# Patient Record
Sex: Male | Born: 1990 | Race: White | Hispanic: No | Marital: Single | Smoking: Current some day smoker
Health system: Southern US, Community
[De-identification: ages and names within clinical notes are randomized; demographics above are authoritative.]

## PROBLEM LIST (undated history)

## (undated) DIAGNOSIS — B360 Pityriasis versicolor: Secondary | ICD-10-CM

## (undated) DIAGNOSIS — N200 Calculus of kidney: Secondary | ICD-10-CM

## (undated) HISTORY — PX: ROTATOR CUFF REPAIR: SHX139

## (undated) HISTORY — PX: ANTERIOR CRUCIATE LIGAMENT REPAIR: SHX115

---
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
## 2008-08-08 IMAGING — CT CT ABDOMEN W/O CM
1 of 2 series · 15 of 32 positions shown, 19 images · non-contrast
Comparison: none

HISTORY: Right lower quadrant abdominal pain, unable to urinate

[Series 2: abd|pel w/o 5.0 b40f · axial · non-contrast · 0.70mm/px · z∈[-514,-84]mm · 15 of 94 slices shown, 19 images]
[im 4/94  soft-tissue]
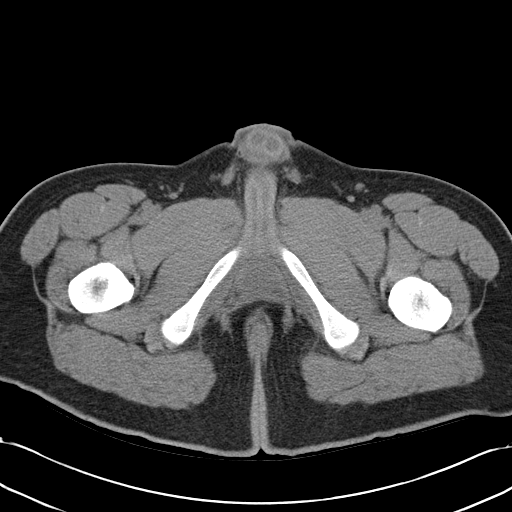
[im 4/94  bone]
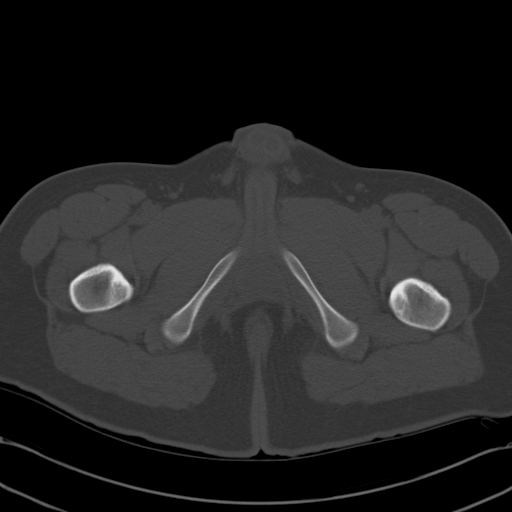
[im 12/94  soft-tissue]
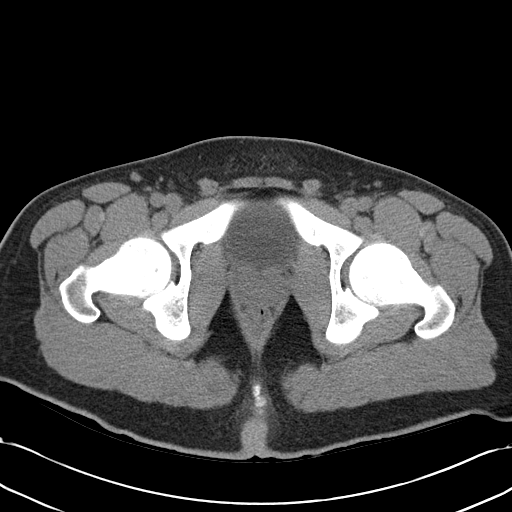
[im 20/94  soft-tissue]
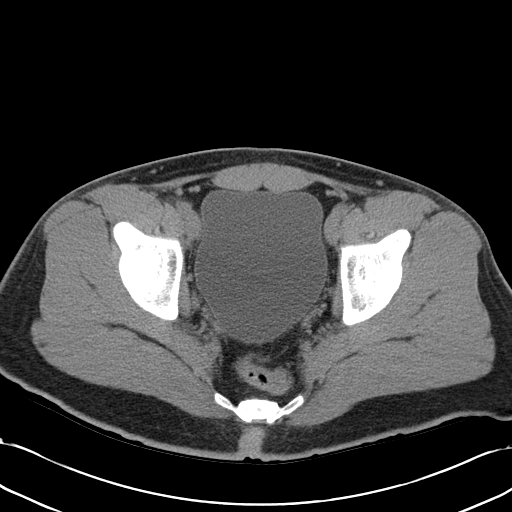
[im 28/94  soft-tissue]
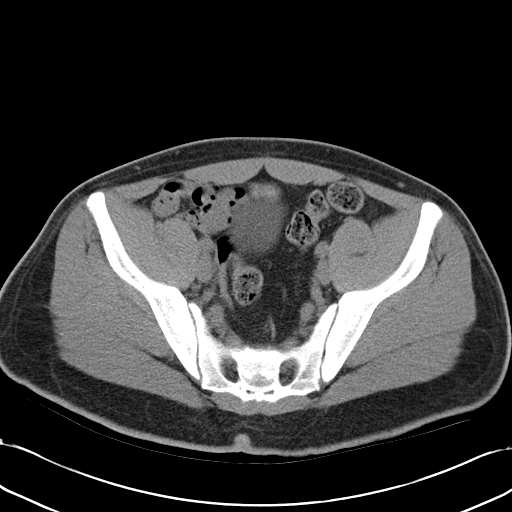
[im 32/94  soft-tissue]
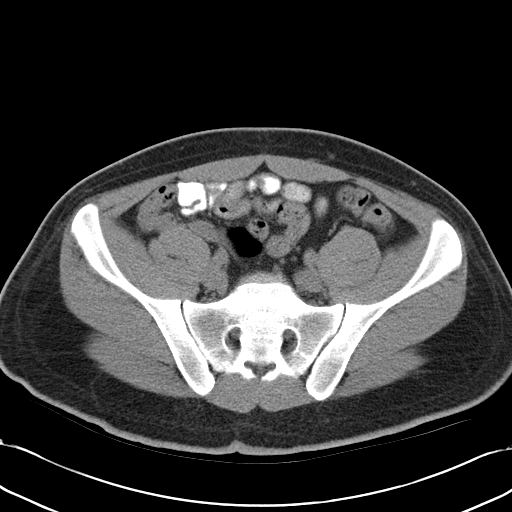
[im 39/94  soft-tissue]
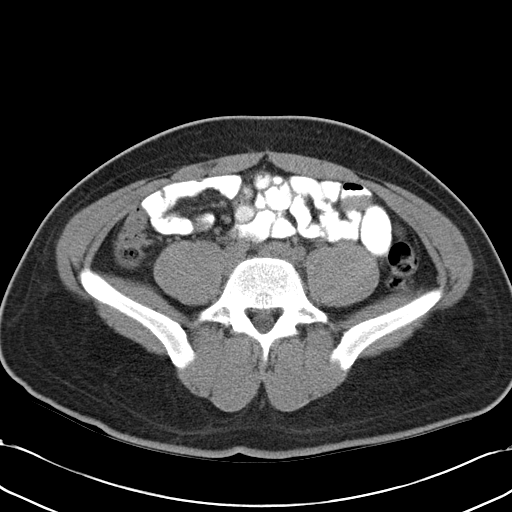
[im 47/94  soft-tissue]
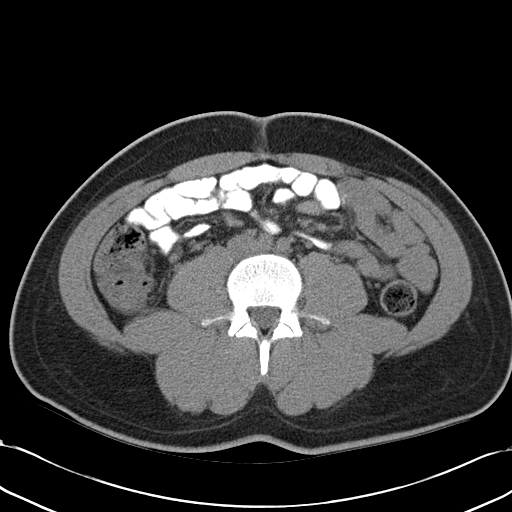
[im 55/94  soft-tissue]
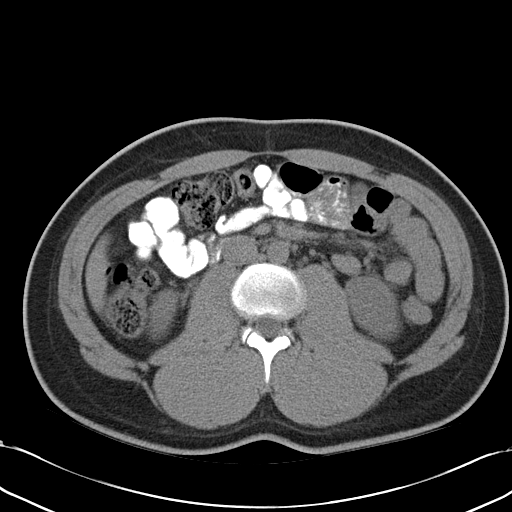
[im 63/94  soft-tissue]
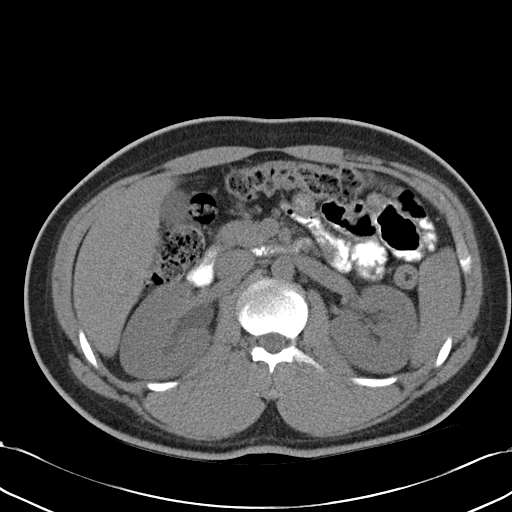
[im 63/94  bone]
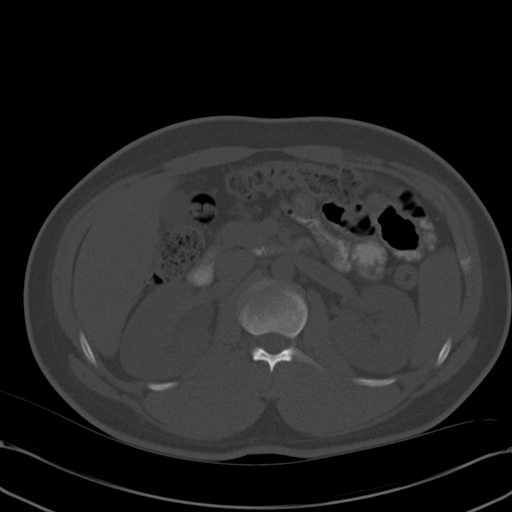
[im 66/94  soft-tissue]
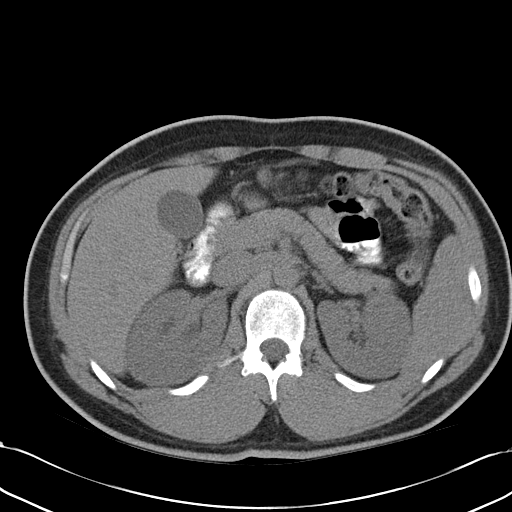
[im 74/94  soft-tissue]
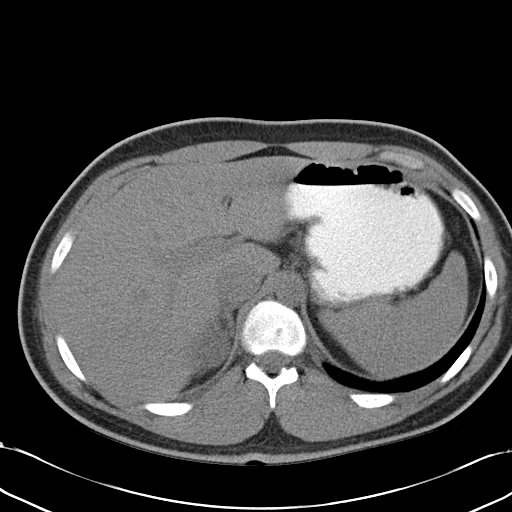
[im 78/94  lung]
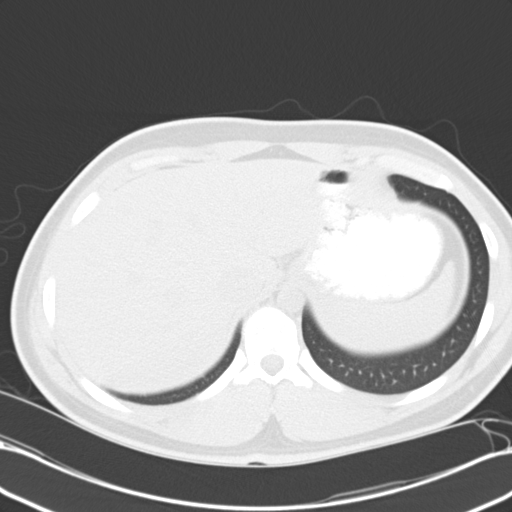
[im 82/94  soft-tissue]
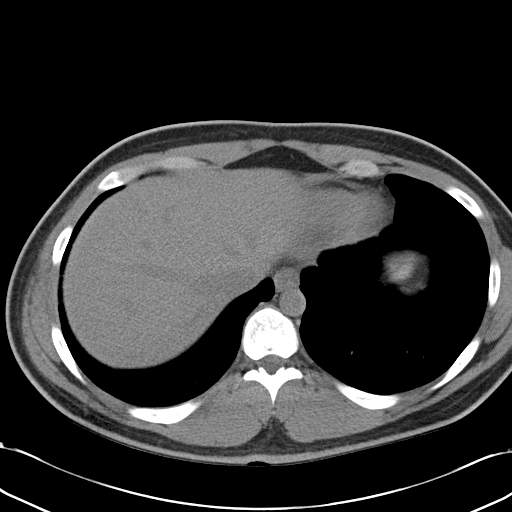
[im 82/94  lung]
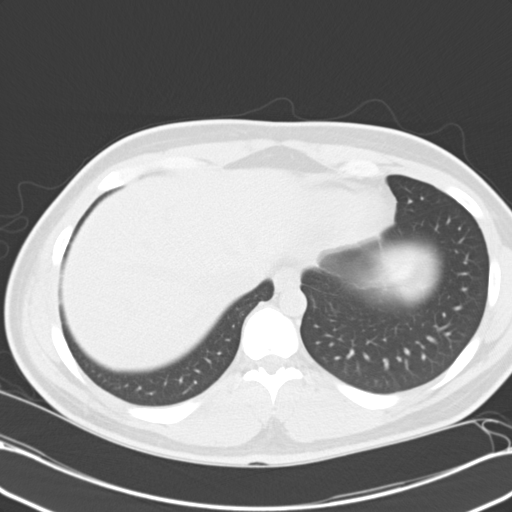
[im 86/94  lung]
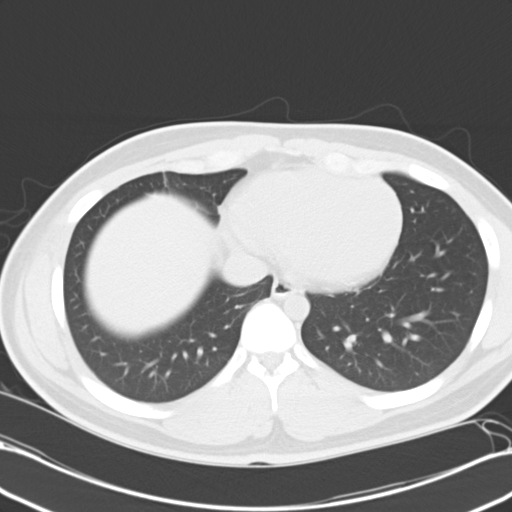
[im 90/94  soft-tissue]
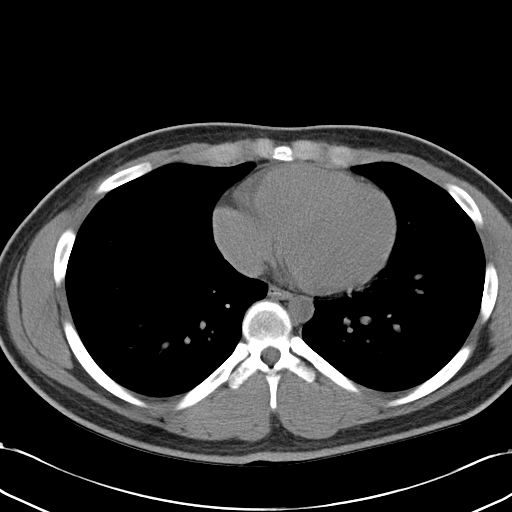
[im 90/94  lung]
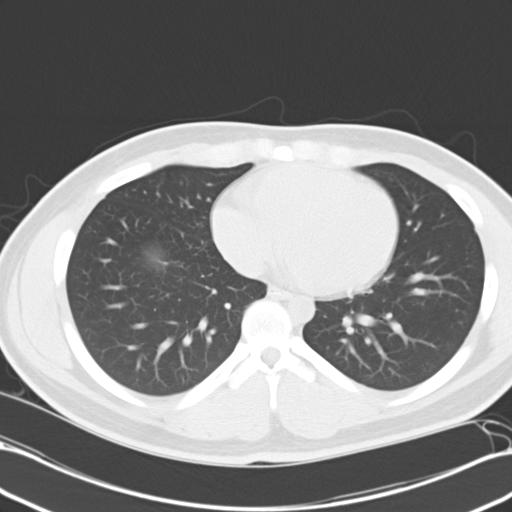

[15 of 32 positions shown; findings below may reference images not displayed]

CT ABDOMEN AND PELVIS WITHOUT CONTRAST:

Multidetector noncontrast helical CT imaging abdomen and pelvis performed.
Sagittal and coronal images reconstructed from axial data set.
No prior exam for comparison.

CT ABDOMEN:

Lung bases clear.
Mild right hydronephrosis secondary to proximal right ureteral calculus 2.5 mm
diameter seen adjacent to right psoas muscle image 42.
No other urinary tract calcification evident.
Vague low attenuation focus lateral aspect of mid right kidney 9 mm diameter
image 27 question cyst.
Remaining solid organs and bowel loops in upper abdomen unremarkable for exam
lacking IV contrast.
Few normal size mesenteric lymph nodes in right midabdomen.
No upper abdominal mass, adenopathy, free fluid, or inflammatory process.
IMPRESSION: 2.5 mm diameter proximal right ureteral calculus causing mild right
hydronephrosis.

CT PELVIS:

Normal appendix.
Normal appearance of bladder and distal ureters without additional
calcification.
Large and small bowel loops in pelvis normal.
No pelvic mass, adenopathy, free fluid, or inflammatory process.
No bone abnormalities.
IMPRESSION: No additional pelvic abnormalities, see CT abdomen report.

## 2009-01-26 ENCOUNTER — Emergency Department (HOSPITAL_COMMUNITY): Admission: EM | Admit: 2009-01-26 | Discharge: 2009-01-26 | Payer: Self-pay | Admitting: Emergency Medicine
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
## 2009-02-08 ENCOUNTER — Emergency Department (HOSPITAL_COMMUNITY): Admission: EM | Admit: 2009-02-08 | Discharge: 2009-02-08 | Payer: Self-pay | Admitting: Emergency Medicine

## 2009-08-07 ENCOUNTER — Ambulatory Visit (HOSPITAL_COMMUNITY): Admission: RE | Admit: 2009-08-07 | Discharge: 2009-08-07 | Payer: Self-pay | Admitting: Family Medicine

## 2009-12-17 ENCOUNTER — Emergency Department (HOSPITAL_COMMUNITY): Admission: EM | Admit: 2009-12-17 | Discharge: 2009-12-17 | Payer: Self-pay | Admitting: Emergency Medicine

## 2010-07-04 IMAGING — CT CT PELVIS W/ CM
2 of 4 series · 17 of 46 positions shown, 19 images · IV contrast (Omnipaque 300)
Comparison: 08/27/2007

CT ABDOMEN

CLINICAL DATA: Right-sided flank pain

CT ABDOMEN AND PELVIS WITH CONTRAST
TECHNIQUE: Multidetector CT imaging of the abdomen and pelvis was
performed using the standard protocol following bolus
administration of intravenous contrast.
Contrast: 100 ml Omniscan 300 IV contrast

[Series 2: abd_pel_with 5.0 b40f · axial · 0.78mm/px · z∈[-520,-120]mm · 14 of 88 slices shown, 16 images]
[im 4/88  soft-tissue]
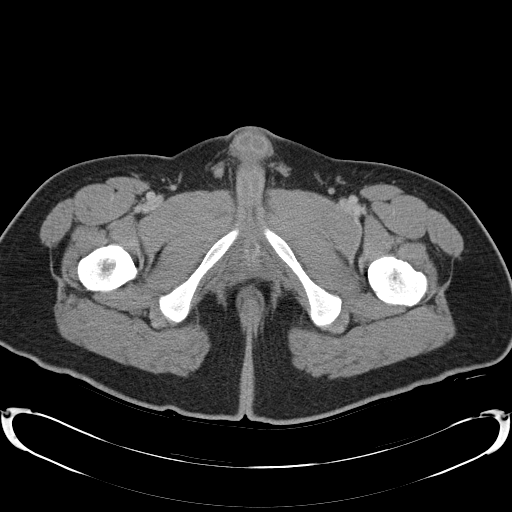
[im 4/88  bone]
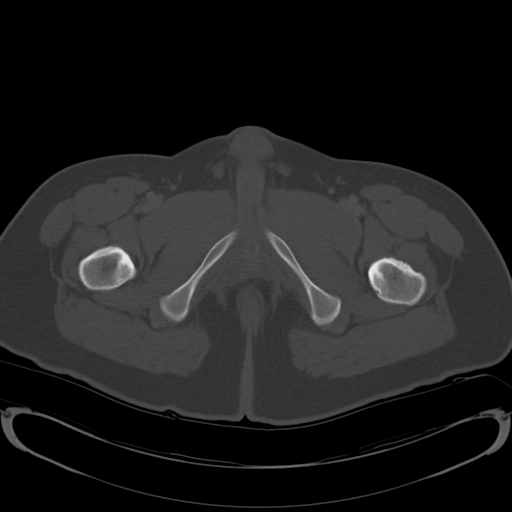
[im 11/88  soft-tissue]
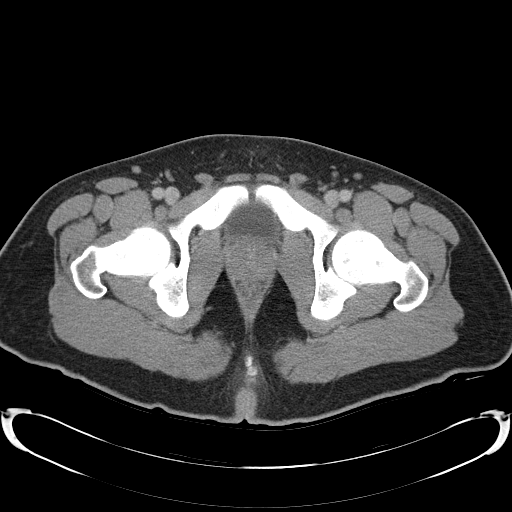
[im 18/88  soft-tissue]
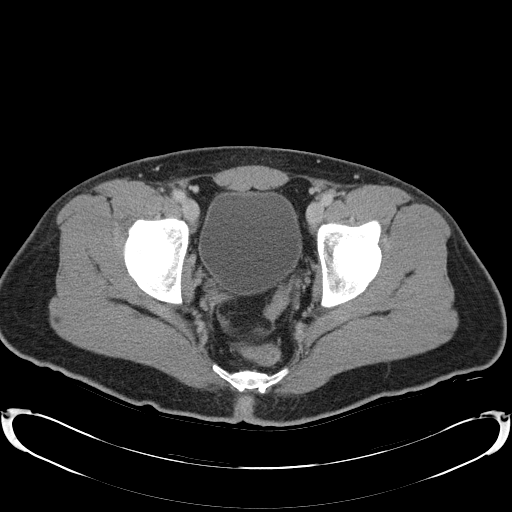
[im 25/88  soft-tissue]
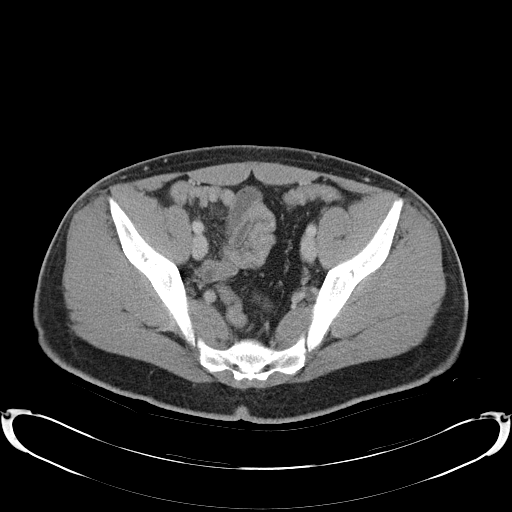
[im 28/88  soft-tissue]
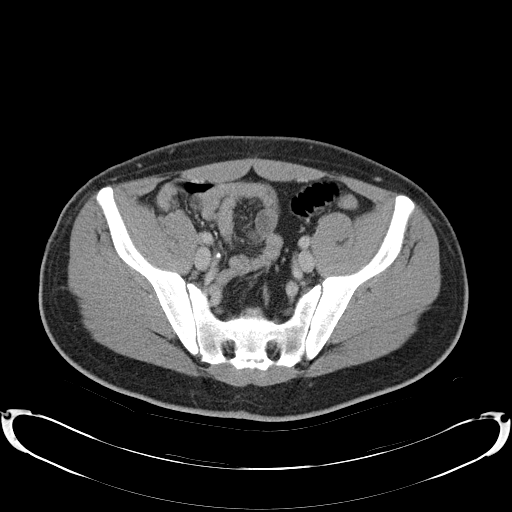
[im 35/88  soft-tissue]
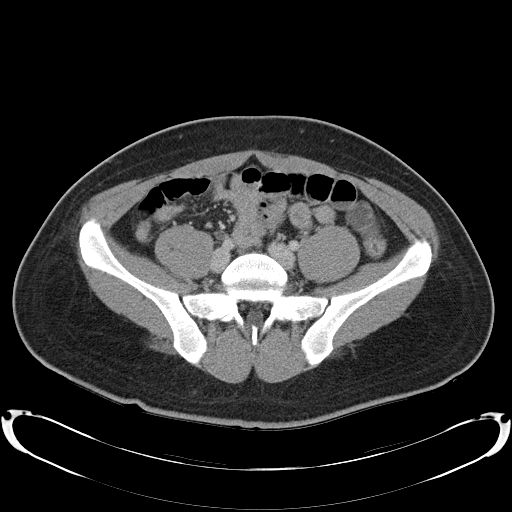
[im 42/88  soft-tissue]
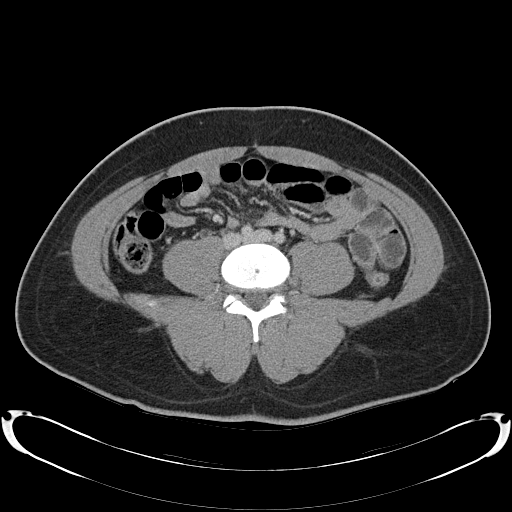
[im 46/88  soft-tissue]
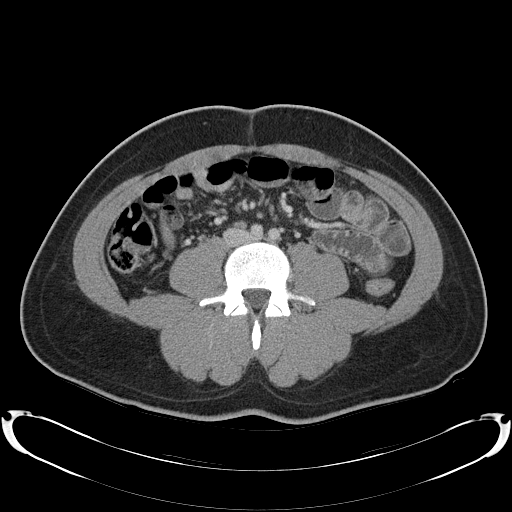
[im 53/88  soft-tissue]
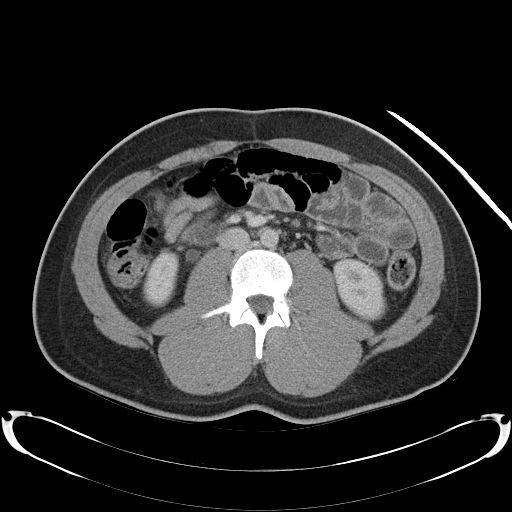
[im 53/88  bone]
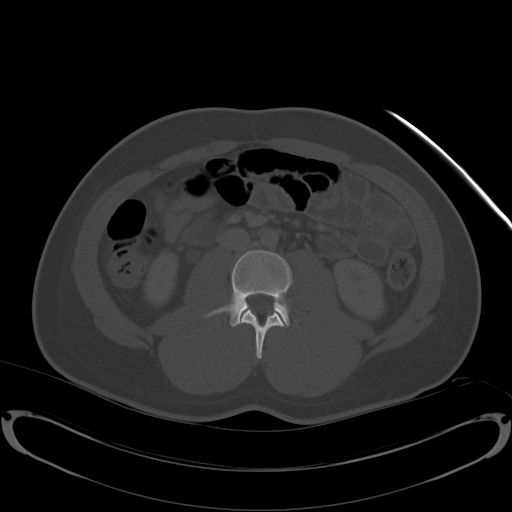
[im 60/88  soft-tissue]
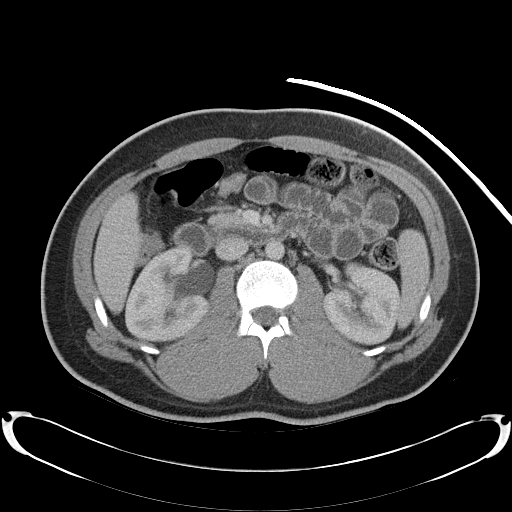
[im 67/88  soft-tissue]
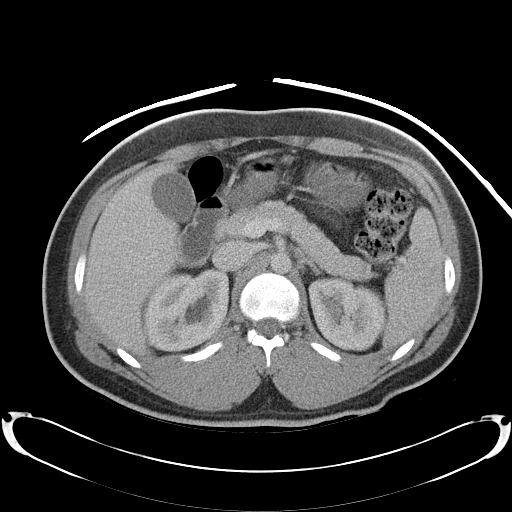
[im 70/88  soft-tissue]
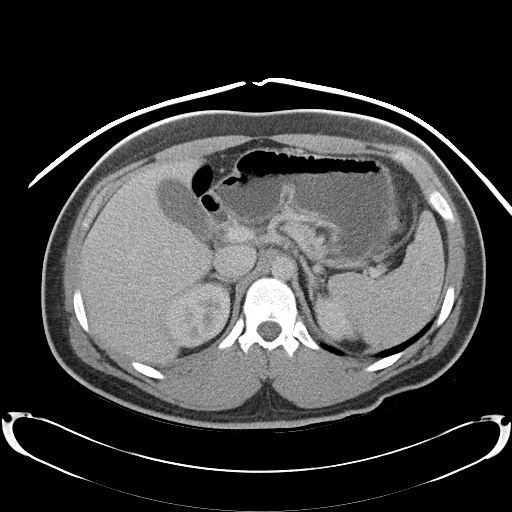
[im 77/88  soft-tissue]
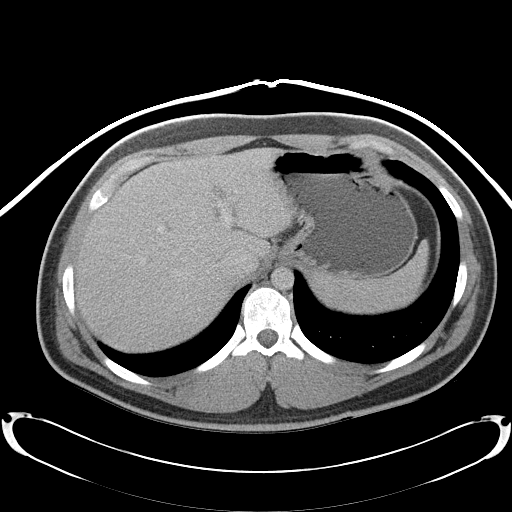
[im 84/88  soft-tissue]
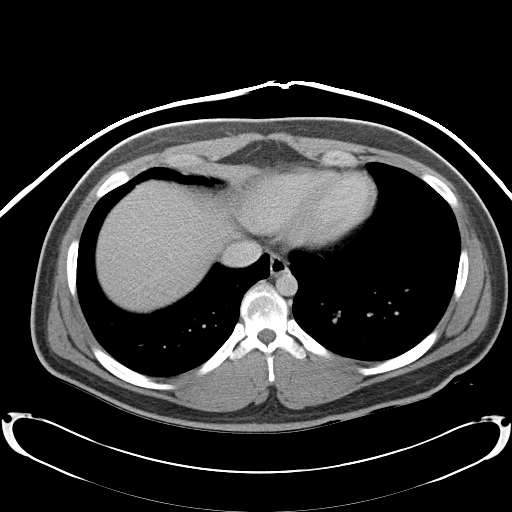

[Series 4: mpr cor post contrast (id) · coronal · 0.78mm/px · 3 of 76 slices shown]
[im 26/76  soft-tissue]
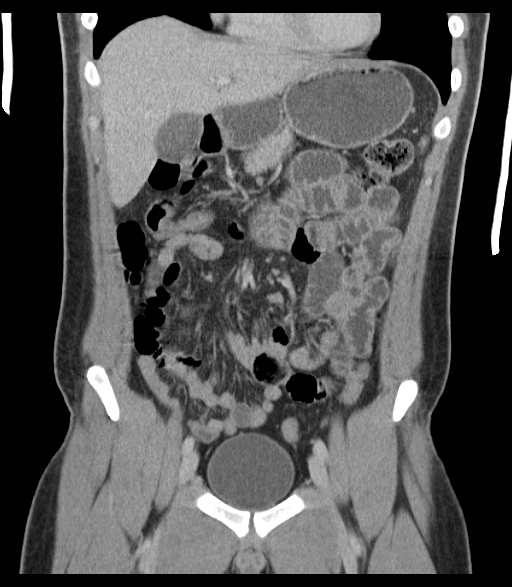
[im 34/76  soft-tissue]
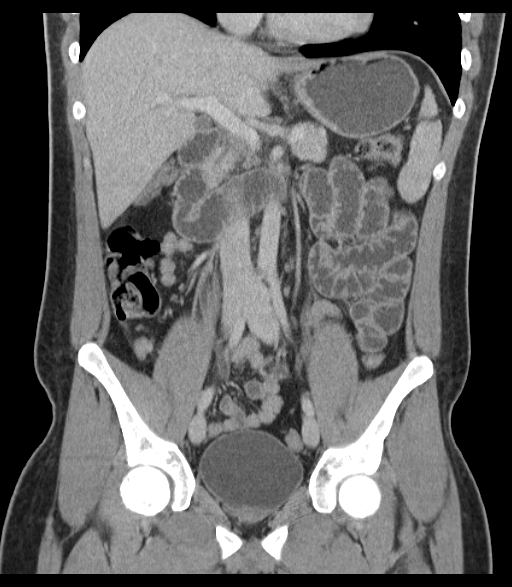
[im 42/76  soft-tissue]
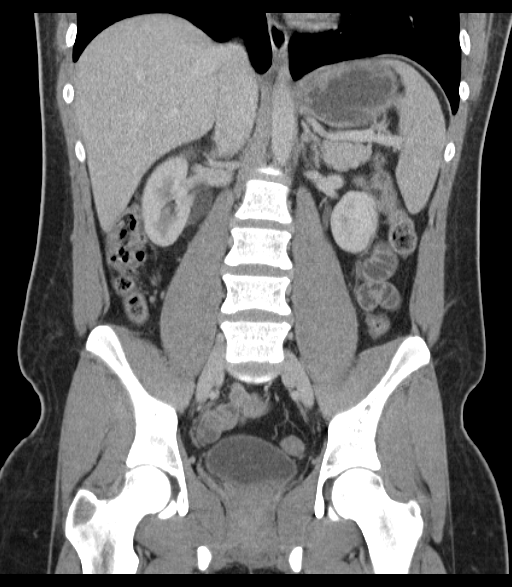

[17 of 46 positions shown; findings below may reference images not displayed]

FINDINGS: Mild fullness of the right intrarenal collecting system
and the ureter is noted to the level of a 4 mm distal right
ureteral calculus on image 61.  No other radiopaque renal or
ureteral calculus is seen.  Too small to characterize right renal
cortical hypodensities again noted.  The abdominal viscera are
otherwise unremarkable.  No ascites or lymphadenopathy.
IMPRESSION: 4 mm distal right ureteral calculus with mild fullness of the right
infrarenal collecting system and right ureter.

CT PELVIS
FINDINGS: The bowel is unremarkable.  The appendix is not
definitely identified but there is no secondary evidence for acute
appendicitis.  Bladder is unremarkable.  No free fluid or
lymphadenopathy.  No acute osseous abnormality.  Probable bone
island again noted in the region of the left acetabulum.
IMPRESSION: No acute intrapelvic finding.  Please see CT abdomen report above.

## 2010-08-21 LAB — CBC
MCHC: 34.9 g/dL (ref 30.0–36.0)
MCV: 95 fL (ref 78.0–100.0)
RBC: 4.71 MIL/uL (ref 4.22–5.81)
RDW: 12.8 % (ref 11.5–15.5)

## 2010-08-21 LAB — COMPREHENSIVE METABOLIC PANEL
AST: 19 U/L (ref 0–37)
CO2: 25 mEq/L (ref 19–32)
Calcium: 9.5 mg/dL (ref 8.4–10.5)
Creatinine, Ser: 0.89 mg/dL (ref 0.4–1.5)
GFR calc Af Amer: >60 mL/min (ref >60)
GFR calc non Af Amer: >60 mL/min (ref >60)
Glucose, Bld: 101 mg/dL — ABNORMAL HIGH (ref 70–99)
Total Protein: 7.5 g/dL (ref 6.0–8.3)

## 2010-08-21 LAB — URINALYSIS, ROUTINE W REFLEX MICROSCOPIC
Glucose, UA: NEGATIVE mg/dL
Ketones, ur: 15 mg/dL — AB
Leukocytes, UA: NEGATIVE
Nitrite: NEGATIVE
Nitrite: NEGATIVE
Protein, ur: 100 mg/dL — AB
Specific Gravity, Urine: 1.015 (ref 1.005–1.030)
Urobilinogen, UA: 1 mg/dL (ref 0.0–1.0)
pH: 8.5 — ABNORMAL HIGH (ref 5.0–8.0)

## 2010-08-21 LAB — DIFFERENTIAL
Lymphocytes Relative: 12 % (ref 12–46)
Lymphs Abs: 1.4 10*3/uL (ref 0.7–4.0)
Monocytes Relative: 7 % (ref 3–12)
Neutrophils Relative %: 81 % — ABNORMAL HIGH (ref 43–77)

## 2010-08-21 LAB — POCT I-STAT, CHEM 8
BUN: 14 mg/dL (ref 6–23)
Calcium, Ion: 1.16 mmol/L (ref 1.12–1.32)
Creatinine, Ser: 1.1 mg/dL (ref 0.4–1.5)
Glucose, Bld: 84 mg/dL (ref 70–99)
Hemoglobin: 14.6 g/dL (ref 13.0–17.0)
Sodium: 141 mEq/L (ref 135–145)
TCO2: 25 mmol/L (ref 0–100)

## 2010-08-21 LAB — LIPASE, BLOOD: Lipase: 20 U/L (ref 11–59)

## 2010-08-21 LAB — URINE MICROSCOPIC-ADD ON
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
## 2011-02-24 LAB — CBC
MCV: 91.9
Platelets: 247
WBC: 13.8 — ABNORMAL HIGH

## 2011-02-24 LAB — URINALYSIS, ROUTINE W REFLEX MICROSCOPIC
Bilirubin Urine: NEGATIVE
Nitrite: NEGATIVE
pH: 6.5

## 2011-02-24 LAB — DIFFERENTIAL
Eosinophils Absolute: 0
Lymphocytes Relative: 9 — ABNORMAL LOW
Lymphs Abs: 1.2
Neutro Abs: 11.4 — ABNORMAL HIGH
Neutrophils Relative %: 83 — ABNORMAL HIGH

## 2011-02-24 LAB — LIPASE, BLOOD: Lipase: 15

## 2011-02-24 LAB — BASIC METABOLIC PANEL
BUN: 11
Calcium: 9.3
Creatinine, Ser: 1.01
GFR calc non Af Amer: >60

## 2012-04-17 ENCOUNTER — Emergency Department (HOSPITAL_COMMUNITY)
Admission: EM | Admit: 2012-04-17 | Discharge: 2012-04-17 | Disposition: A | Discharge status: Home / Self Care | Payer: Self-pay | Attending: Emergency Medicine | Admitting: Emergency Medicine

## 2012-04-17 ENCOUNTER — Encounter (HOSPITAL_COMMUNITY): Payer: Self-pay | Admitting: Emergency Medicine

## 2012-04-17 ENCOUNTER — Encounter (HOSPITAL_COMMUNITY): Payer: Self-pay | Admitting: Nurse Practitioner

## 2012-04-17 DIAGNOSIS — R110 Nausea: Secondary | ICD-10-CM | POA: Insufficient documentation

## 2012-04-17 DIAGNOSIS — Z87442 Personal history of urinary calculi: Secondary | ICD-10-CM | POA: Insufficient documentation

## 2012-04-17 DIAGNOSIS — N201 Calculus of ureter: Secondary | ICD-10-CM | POA: Insufficient documentation

## 2012-04-17 DIAGNOSIS — R109 Unspecified abdominal pain: Secondary | ICD-10-CM | POA: Insufficient documentation

## 2012-04-17 DIAGNOSIS — R319 Hematuria, unspecified: Secondary | ICD-10-CM | POA: Insufficient documentation

## 2012-04-17 HISTORY — DX: Calculus of kidney: N20.0

## 2012-04-17 LAB — COMPREHENSIVE METABOLIC PANEL
ALT: 18 U/L (ref 0–53)
AST: 19 U/L (ref 0–37)
Albumin: 4.5 g/dL (ref 3.5–5.2)
Alkaline Phosphatase: 71 U/L (ref 39–117)
BUN: 13 mg/dL (ref 6–23)
CO2: 25 mEq/L (ref 19–32)
Calcium: 9.8 mg/dL (ref 8.4–10.5)
Chloride: 104 mEq/L (ref 96–112)
Creatinine, Ser: 0.92 mg/dL (ref 0.50–1.35)
GFR calc Af Amer: >90 mL/min (ref >90)
GFR calc non Af Amer: >90 mL/min (ref >90)
Glucose, Bld: 109 mg/dL — ABNORMAL HIGH (ref 70–99)
Potassium: 3.8 mEq/L (ref 3.5–5.1)
Sodium: 141 mEq/L (ref 135–145)
Total Bilirubin: 0.5 mg/dL (ref 0.3–1.2)
Total Protein: 7.8 g/dL (ref 6.0–8.3)

## 2012-04-17 LAB — URINALYSIS, MICROSCOPIC ONLY
Bilirubin Urine: NEGATIVE
Glucose, UA: NEGATIVE mg/dL
Ketones, ur: NEGATIVE mg/dL
Leukocytes, UA: NEGATIVE
Nitrite: NEGATIVE
Protein, ur: NEGATIVE mg/dL
Specific Gravity, Urine: 1.028 (ref 1.005–1.030)
Urobilinogen, UA: 0.2 mg/dL (ref 0.0–1.0)
pH: 6 (ref 5.0–8.0)

## 2012-04-17 LAB — CBC WITH DIFFERENTIAL/PLATELET
Basophils Absolute: 0 10*3/uL (ref 0.0–0.1)
Basophils Relative: 0 % (ref 0–1)
Eosinophils Absolute: 0 10*3/uL (ref 0.0–0.7)
Eosinophils Relative: 0 % (ref 0–5)
HCT: 44.3 % (ref 39.0–52.0)
Hemoglobin: 15.2 g/dL (ref 13.0–17.0)
Lymphocytes Relative: 19 % (ref 12–46)
Lymphs Abs: 2 10*3/uL (ref 0.7–4.0)
MCH: 30.8 pg (ref 26.0–34.0)
MCHC: 34.3 g/dL (ref 30.0–36.0)
MCV: 89.9 fL (ref 78.0–100.0)
Monocytes Absolute: 0.8 10*3/uL (ref 0.1–1.0)
Monocytes Relative: 8 % (ref 3–12)
Neutro Abs: 7.7 10*3/uL (ref 1.7–7.7)
Neutrophils Relative %: 73 % (ref 43–77)
Platelets: 206 10*3/uL (ref 150–400)
RBC: 4.93 MIL/uL (ref 4.22–5.81)
RDW: 13.5 % (ref 11.5–15.5)
WBC: 10.6 10*3/uL — ABNORMAL HIGH (ref 4.0–10.5)

## 2012-04-17 LAB — LIPASE, BLOOD: Lipase: 20 U/L (ref 11–59)

## 2012-04-17 MED ORDER — ONDANSETRON HCL 4 MG/2ML IJ SOLN
4.0000 mg | Freq: Once | INTRAMUSCULAR | Status: AC
  Administered 2012-04-17: 4 mg via INTRAVENOUS
  Filled 2012-04-17: qty 2

## 2012-04-17 MED ORDER — TAMSULOSIN HCL 0.4 MG PO CAPS: 0.4000 mg | ORAL_CAPSULE | Freq: Every day | ORAL | Status: DC

## 2012-04-17 MED ORDER — HYDROMORPHONE HCL PF 1 MG/ML IJ SOLN
1.0000 mg | Freq: Once | INTRAMUSCULAR | Status: AC
  Administered 2012-04-17: 1 mg via INTRAVENOUS
  Filled 2012-04-17: qty 1

## 2012-04-17 MED ORDER — OXYCODONE-ACETAMINOPHEN 5-325 MG PO TABS: 1.0000 | ORAL_TABLET | ORAL | Status: DC | PRN

## 2012-04-17 MED ORDER — ONDANSETRON HCL 4 MG PO TABS: 4.0000 mg | ORAL_TABLET | Freq: Four times a day (QID) | ORAL | Status: DC

## 2012-04-17 MED ORDER — KETOROLAC TROMETHAMINE 30 MG/ML IJ SOLN
30.0000 mg | Freq: Once | INTRAMUSCULAR | Status: AC
  Administered 2012-04-17: 30 mg via INTRAVENOUS
  Filled 2012-04-17: qty 1

## 2012-04-17 NOTE — ED Provider Notes (Signed)
History     21 year old male with flank pain. Left-sided. Triage note reviewed, but pt says pain has never been on R. T. onset approximately 2 hours prior to presentation. Pain is sharp and radiates down into his groin. No appreciable exacerbating relieving factors. No urinary complaints. Reports prior history kidney stones and says her current symptoms feel similar. Denies previous abdominal or pelvic surgery. Nausea but no vomiting. No testicular pain or swelling. No penile discharge. No rash. No fever or chills.   CSN: [id number]  Arrival date & time 04/17/12  1412   First MD Initiated Contact with Patient 04/17/12 1911      Chief Complaint  Patient presents with  . Abdominal Pain    (Consider location/radiation/quality/duration/timing/severity/associated sxs/prior treatment) HPI  Past Medical History  Diagnosis Date  . Kidney stone     Past Surgical History  Procedure Date  . Anterior cruciate ligament repair     No family history on file.  History  Substance Use Topics  . Smoking status: Never Smoker   . Smokeless tobacco: Current User    Types: Chew  . Alcohol Use: Yes      Review of Systems   Review of symptoms negative unless otherwise noted in HPI.   Allergies  Review of patient's allergies indicates no known allergies.  Home Medications  No current outpatient prescriptions on file.  BP 131/67  Pulse 86  Temp 98.8 F (37.1 C) (Oral)  Resp 16  SpO2 99%  Physical Exam  Nursing note and vitals reviewed. Constitutional: He appears well-developed and well-nourished. No distress.  HENT:  Head: Normocephalic and atraumatic.  Eyes: Conjunctivae normal are normal. Right eye exhibits no discharge. Left eye exhibits no discharge.  Neck: Neck supple.  Cardiovascular: Normal rate, regular rhythm and normal heart sounds.  Exam reveals no gallop and no friction rub.   No murmur heard. Pulmonary/Chest: Effort normal and breath sounds normal. No  respiratory distress.  Abdominal: Soft. He exhibits no distension. There is no tenderness. There is no rebound and no guarding.  Genitourinary:       Left costovertebral angle tenderness  Musculoskeletal: He exhibits tenderness. He exhibits no edema.  Neurological: He is alert.  Skin: Skin is warm and dry.  Psychiatric: He has a normal mood and affect. His behavior is normal. Thought content normal.    ED Course  Procedures (including critical care time)  Labs Reviewed  CBC WITH DIFFERENTIAL - Abnormal; Notable for the following:    WBC 10.6 (*)     All other components within normal limits  COMPREHENSIVE METABOLIC PANEL - Abnormal; Notable for the following:    Glucose, Bld 109 (*)     All other components within normal limits  URINALYSIS, MICROSCOPIC ONLY - Abnormal; Notable for the following:    Hgb urine dipstick LARGE (*)     All other components within normal limits  LIPASE, BLOOD  LAB REPORT - SCANNED   No results found.   1. Flank pain       MDM  21 year old male with left flank pain. History kidney stones states that current symptoms feel similar. UA with blood consistent with stones. Urinalysis otherwise not suggestive of infection. Abdominal exam benign. Patient is afebrile. No ongoing vomiting. Renal function is normal. Imaging considered but deferred. Plan expectant management. Emergent return cautions were discussed. Outpatient urological followup otherwise.        Raeford Razor, MD 04/21/12 [phone]

## 2012-04-17 NOTE — ED Notes (Signed)
Patient c/o left sided pain since 1300 today; patient was just discharged from [facility] ER for flank pain.  Pain returned when he got home; called Eunice back and was told to come to ER.

## 2012-04-17 NOTE — ED Notes (Signed)
Pt c/o R side pain onset 2 hours ago. Describes as "stabbing" and states it feels like a kidney stone. Denies bowel/bladder changes

## 2012-04-17 NOTE — ED Notes (Signed)
Patient stated BP cuff was too tight and took it off; unable to obtain vitals.

## 2012-04-18 ENCOUNTER — Emergency Department (HOSPITAL_COMMUNITY)
Admission: EM | Admit: 2012-04-18 | Discharge: 2012-04-18 | Disposition: A | Discharge status: Home / Self Care | Payer: Self-pay | Attending: Emergency Medicine | Admitting: Emergency Medicine

## 2012-04-18 ENCOUNTER — Emergency Department (HOSPITAL_COMMUNITY): Payer: Self-pay

## 2012-04-18 DIAGNOSIS — N201 Calculus of ureter: Secondary | ICD-10-CM

## 2012-04-18 MED ORDER — ONDANSETRON HCL 4 MG/2ML IJ SOLN
4.0000 mg | Freq: Once | INTRAMUSCULAR | Status: AC
  Administered 2012-04-18: 4 mg via INTRAVENOUS
  Filled 2012-04-18: qty 2

## 2012-04-18 MED ORDER — KETOROLAC TROMETHAMINE 30 MG/ML IJ SOLN
30.0000 mg | Freq: Once | INTRAMUSCULAR | Status: AC
  Administered 2012-04-18: 30 mg via INTRAVENOUS
  Filled 2012-04-18: qty 1

## 2012-04-18 MED ORDER — SODIUM CHLORIDE 0.9 % IV BOLUS (SEPSIS)
1000.0000 mL | Freq: Once | INTRAVENOUS | Status: AC
  Administered 2012-04-18: 1000 mL via INTRAVENOUS

## 2012-04-18 MED ORDER — HYDROMORPHONE HCL PF 1 MG/ML IJ SOLN
1.0000 mg | Freq: Once | INTRAMUSCULAR | Status: AC
  Administered 2012-04-18: 1 mg via INTRAVENOUS
  Filled 2012-04-18: qty 1

## 2012-04-18 NOTE — ED Provider Notes (Signed)
History     CSN: [id number]  Arrival date & time 04/17/12  2329   First MD Initiated Contact with Patient 04/18/12 0034      Chief Complaint  Patient presents with  . Abdominal Pain    (Consider location/radiation/quality/duration/timing/severity/associated sxs/prior treatment) HPI Comments: 21 yo male with ho ks present with acute onset of intermittent sharp and stabbing pain that starts in the left pain radiates to the left upper quadrant and left lower quadrant. He has associated hematuria. He was seen at another emergency department earlier in the day, diagnosed with a kidney stone after he was found to have hematuria and typical kidney stone pain. At this time the pain has returned, it is not improved with home medications. He has associated nausea but denies chest pain, cough, shortness of breath, headache, diarrhea, swelling, rashes or any other complaints.  Patient is a 21 y.o. male presenting with abdominal pain. The history is provided by the patient and medical records.  Abdominal Pain The primary symptoms of the illness include abdominal pain.    Past Medical History  Diagnosis Date  . Kidney stone     Past Surgical History  Procedure Date  . Anterior cruciate ligament repair     No family history on file.  History  Substance Use Topics  . Smoking status: Never Smoker   . Smokeless tobacco: Current User    Types: Chew  . Alcohol Use: Yes      Review of Systems  Gastrointestinal: Positive for abdominal pain.  All other systems reviewed and are negative.    Allergies  Review of patient's allergies indicates no known allergies.  Home Medications   Current Outpatient Rx  Name  Route  Sig  Dispense  Refill  . ONDANSETRON HCL 4 MG PO TABS   Oral   Take 1 tablet (4 mg total) by mouth every 6 (six) hours.   12 tablet   0   . OXYCODONE-ACETAMINOPHEN 5-325 MG PO TABS   Oral   Take 1-2 tablets by mouth every 4 (four) hours as needed for pain.   20  tablet   0   . TAMSULOSIN HCL 0.4 MG PO CAPS   Oral   Take 1 capsule (0.4 mg total) by mouth daily.   5 capsule   0     BP 121/61  Pulse 85  Temp 98.3 F (36.8 C) (Oral)  Resp 20  SpO2 97%  Physical Exam  Nursing note and vitals reviewed. Constitutional: He appears well-developed and well-nourished. He appears distressed.  HENT:  Head: Normocephalic and atraumatic.  Mouth/Throat: Oropharynx is clear and moist. No oropharyngeal exudate.  Eyes: Conjunctivae normal and EOM are normal. Pupils are equal, round, and reactive to light. Right eye exhibits no discharge. Left eye exhibits no discharge. No scleral icterus.  Neck: Normal range of motion. Neck supple. No JVD present. No thyromegaly present.  Cardiovascular: Normal rate, regular rhythm, normal heart sounds and intact distal pulses.  Exam reveals no gallop and no friction rub.   No murmur heard. Pulmonary/Chest: Effort normal and breath sounds normal. No respiratory distress. He has no wheezes. He has no rales.  Abdominal: Soft. Bowel sounds are normal. He exhibits no distension and no mass. There is tenderness ( Minimal left upper quadrant tenderness, no guarding, no peritoneal signs, positive CVA tenderness on the left).  Musculoskeletal: Normal range of motion. He exhibits no edema and no tenderness.  Lymphadenopathy:    He has no cervical adenopathy.  Neurological:  He is alert. Coordination normal.  Skin: Skin is warm and dry. No rash noted. No erythema.  Psychiatric: He has a normal mood and affect. His behavior is normal.    ED Course  Procedures (including critical care time)  Labs Reviewed - No data to display Ct Abdomen Pelvis Wo Contrast  04/18/2012  *RADIOLOGY REPORT*  Clinical Data: Left upper quadrant pain.  Urolithiasis.  CT ABDOMEN AND PELVIS WITHOUT CONTRAST  Technique:  Multidetector CT imaging of the abdomen and pelvis was performed following the standard protocol without intravenous contrast.   Comparison: 01/26/2009  Findings: No evidence of renal calculi or hydronephrosis.  No evidence of ureteral calculi or dilatation.  No bladder calculi identified.  The other abdominal parenchymal organs are also normal in appearance on this non contrast study.  Gallbladder is unremarkable.  No soft tissue masses or lymphadenopathy identified. No evidence of inflammatory process or abnormal fluid collections. No evidence of dilated bowel loops or hernia.  No suspicious bone lesions are identified.  IMPRESSION: No evidence of urolithiasis, hydronephrosis, or other acute findings.   Original Report Authenticated By: Myles Rosenthal, M.D.      1. Ureterolithiasis       MDM  Present to having nephro or ureterolithiasis, vital signs overall unremarkable, the patient has no tachycardia, no fever and reviewed prior lab work showed a normal blood counts, normal renal function and a urinalysis that showed hematuria but no signs of infection. At this time we'll give parenteral medications to help with symptoms, perform a noncontrast CT scan of the abdomen and pelvis to evaluate for size of kidney stone.  Pt states that he felt the stone pass into bladder while in CT - has improved pain - feeling much better - stable for d/c.  Has CT showing no obvious stone.      Vida Roller, MD 04/18/12 [phone]

## 2012-09-08 ENCOUNTER — Emergency Department (HOSPITAL_COMMUNITY)
Admission: EM | Admit: 2012-09-08 | Discharge: 2012-09-08 | Disposition: A | Discharge status: Home / Self Care | Payer: Self-pay | Attending: Emergency Medicine | Admitting: Emergency Medicine

## 2012-09-08 ENCOUNTER — Encounter (HOSPITAL_COMMUNITY): Payer: Self-pay | Admitting: Emergency Medicine

## 2012-09-08 DIAGNOSIS — Y929 Unspecified place or not applicable: Secondary | ICD-10-CM | POA: Insufficient documentation

## 2012-09-08 DIAGNOSIS — S61259A Open bite of unspecified finger without damage to nail, initial encounter: Secondary | ICD-10-CM

## 2012-09-08 DIAGNOSIS — Y9389 Activity, other specified: Secondary | ICD-10-CM | POA: Insufficient documentation

## 2012-09-08 DIAGNOSIS — S61209A Unspecified open wound of unspecified finger without damage to nail, initial encounter: Secondary | ICD-10-CM | POA: Insufficient documentation

## 2012-09-08 DIAGNOSIS — IMO0001 Reserved for inherently not codable concepts without codable children: Secondary | ICD-10-CM | POA: Insufficient documentation

## 2012-09-08 MED ORDER — RABIES IMMUNE GLOBULIN 150 UNIT/ML IM INJ
20.0000 [IU]/kg | INJECTION | Freq: Once | INTRAMUSCULAR | Status: AC
  Administered 2012-09-08: 1800 [IU] via INTRAMUSCULAR
  Filled 2012-09-08: qty 12

## 2012-09-08 MED ORDER — RABIES IMMUNE GLOBULIN 150 UNIT/ML IM INJ
20.0000 [IU]/kg | INJECTION | Freq: Once | INTRAMUSCULAR | Status: DC
  Filled 2012-09-08: qty 12

## 2012-09-08 MED ORDER — RABIES VACCINE, PCEC IM SUSR
1.0000 mL | Freq: Once | INTRAMUSCULAR | Status: AC
  Administered 2012-09-08: 1 mL via INTRAMUSCULAR
  Filled 2012-09-08: qty 1

## 2012-09-08 NOTE — ED Provider Notes (Signed)
History     CSN: [id number]  Arrival date & time 09/08/12  1130   First MD Initiated Contact with Patient 09/08/12 1302      Chief Complaint  Patient presents with  . Animal Bite    (Consider location/radiation/quality/duration/timing/severity/associated sxs/prior treatment) Patient is a 22 y.o. male presenting with animal bite. The history is provided by the patient.  Animal Bite  Pertinent negatives include no chest pain, no visual disturbance, no abdominal pain, no nausea, no vomiting, no headaches and no neck pain.   status post bite from a wild raccoon that had been trapped to be moved to another location. The raccoon no aggressively bit him on his right index fingertip but extensively this occurred 2 weeks ago. Patient has been reading and got concerned about rabies and came in for consultation information whether he needed rabies. The right index finger on never developed any signs of infection. Wound is healed completely. Patient's tetanus is up-to-date last tetanus was a year ago. Patient is asymptomatic other than did have a mild headache yesterday but that has resolved today. No other symptoms. No finger pain.  Past Medical History  Diagnosis Date  . Kidney stone     Past Surgical History  Procedure Laterality Date  . Anterior cruciate ligament repair      No family history on file.  History  Substance Use Topics  . Smoking status: Never Smoker   . Smokeless tobacco: Current User    Types: Chew  . Alcohol Use: Yes      Review of Systems  Constitutional: Negative for fever.  HENT: Negative for congestion and neck pain.   Eyes: Negative for visual disturbance.  Respiratory: Negative for shortness of breath.   Cardiovascular: Negative for chest pain.  Gastrointestinal: Negative for nausea, vomiting, abdominal pain and diarrhea.  Musculoskeletal: Negative for myalgias and back pain.  Skin: Positive for wound. Negative for rash.  Neurological: Negative for  headaches.  Hematological: Does not bruise/bleed easily.  Psychiatric/Behavioral: Negative for confusion.    Allergies  Review of patient's allergies indicates no known allergies.  Home Medications   Current Outpatient Rx  Name  Route  Sig  Dispense  Refill  . ibuprofen (ADVIL,MOTRIN) 200 MG tablet   Oral   Take 600 mg by mouth every 6 (six) hours as needed for pain.           BP 109/71  Pulse 98  Temp(Src) 98.7 F (37.1 C) (Oral)  Resp 20  Ht 5\' 6"  (1.676 m)  Wt 200 lb (90.719 kg)  BMI 32.3 kg/m2  SpO2 100%  Physical Exam  Nursing note and vitals reviewed. Constitutional: He is oriented to person, place, and time. He appears well-developed and well-nourished. No distress.  HENT:  Head: Normocephalic and atraumatic.  Mouth/Throat: Oropharynx is clear and moist.  Eyes: Conjunctivae and EOM are normal. Pupils are equal, round, and reactive to light. No scleral icterus.  Neck: Normal range of motion. Neck supple.  Cardiovascular: Normal rate, regular rhythm and normal heart sounds.   No murmur heard. Pulmonary/Chest: Effort normal and breath sounds normal. No respiratory distress.  Abdominal: Soft. Bowel sounds are normal. There is no tenderness.  Musculoskeletal: Normal range of motion. He exhibits no edema and no tenderness.  Right index finger tip with a healed wound no evidence of any infection full range of motion no swelling sensory intact capillary refill less than 2 seconds.  Neurological: He is alert and oriented to person, place, and time. No  cranial nerve deficit. He exhibits normal muscle tone. Coordination normal.  Skin: Skin is warm. No rash noted. No erythema.    ED Course  Procedures (including critical care time)  Labs Reviewed - No data to display No results found.   1. Animal bite of finger, initial encounter       MDM  Patient bitten by raccoon 2 weeks ago on his right index finger which is his dominant hand. That wound did bleed it is now  healed no evidence of infection so prophylactic antibiotics not required. Patient tetanus is up-to-date had tetanus one year ago. However most likely does require rabies vaccination to be on safe side due to the significant mortality of rabies outpatient to get the rabies immunoglobulin and first vaccination here and then vaccination schedule.        Shelda Jakes, MD 09/08/12 [phone]

## 2012-09-08 NOTE — ED Notes (Signed)
Patient states that he was bitten on the finger by a racoon about 2 weeks ago.

## 2012-09-11 ENCOUNTER — Encounter (HOSPITAL_COMMUNITY): Payer: Self-pay | Attending: Emergency Medicine

## 2012-09-11 DIAGNOSIS — Z23 Encounter for immunization: Secondary | ICD-10-CM | POA: Insufficient documentation

## 2012-09-11 DIAGNOSIS — T148XXA Other injury of unspecified body region, initial encounter: Secondary | ICD-10-CM | POA: Insufficient documentation

## 2012-09-11 MED ORDER — RABIES VACCINE, PCEC IM SUSR
1.0000 mL | Freq: Once | INTRAMUSCULAR | Status: AC
  Administered 2012-09-11: 1 mL via INTRAMUSCULAR

## 2012-09-11 NOTE — Progress Notes (Signed)
Rabies Vaccine 1 ml given IM Z-track to right deltoid muscle.  Tolerated well.

## 2012-09-18 ENCOUNTER — Ambulatory Visit (HOSPITAL_COMMUNITY): Payer: Self-pay

## 2012-09-25 ENCOUNTER — Ambulatory Visit (HOSPITAL_COMMUNITY): Payer: Self-pay

## 2013-08-13 ENCOUNTER — Encounter: Payer: Self-pay | Admitting: Family Medicine

## 2013-08-13 ENCOUNTER — Ambulatory Visit (INDEPENDENT_AMBULATORY_CARE_PROVIDER_SITE_OTHER): Payer: BC Managed Care – PPO | Admitting: Family Medicine

## 2013-08-13 VITALS — BP 122/80 | Temp 98.3°F | Ht 66.0 in | Wt 231.0 lb

## 2013-08-13 DIAGNOSIS — I889 Nonspecific lymphadenitis, unspecified: Secondary | ICD-10-CM

## 2013-08-13 MED ORDER — PENICILLIN V POTASSIUM 500 MG PO TABS: 500.0000 mg | ORAL_TABLET | Freq: Four times a day (QID) | ORAL | Status: DC

## 2013-08-13 NOTE — Progress Notes (Signed)
   Subjective:    Patient ID: John Brock, male    DOB: [birth year], 23 y.o.   MRN: [id number]  Otalgia  There is pain in the left ear. This is a new problem. The current episode started more than 1 month ago. The problem occurs constantly. The problem has been unchanged. There has been no fever. The pain is moderate. Associated symptoms comments: Jaw and neck pain on left side. He has tried NSAIDs for the symptoms. The treatment provided no relief.  Patient has been dipping tobacco since he was 23 years old.   Discomfort since  Grits teeth at night  Hx bruxism  Slight achiness  Past week substantial pain and tend  Using ibuprofen etc.  Sees dent q five yrs  Patient chews tobacco. Review of Systems  HENT: Positive for ear pain.    some thumb pain some swelling below jaw no fever or chills ROS otherwise negative     Objective:   Physical Exam  Alert no acute distress. TMs normal. Positive gingivitis left greater than right. Positive submandibular lymph nodes tender to palpation neck supple. Lungs clear. Heart regular in rhythm.      Assessment & Plan:  Impression gingivitis with secondary lymphadenitis and poor teeth care. Plan Pen-Vee K 500 4 times a day 10 days. Stop using r chewing tobacco. See dentist. Maceo ProWSL

## 2013-09-12 ENCOUNTER — Ambulatory Visit: Payer: BC Managed Care – PPO | Admitting: Family Medicine

## 2013-09-24 IMAGING — CT CT ABD-PELV W/O CM
2 of 3 series · 9 of 46 positions shown, 11 images · non-contrast
Comparison: 01/26/2009

CLINICAL DATA: Left upper quadrant pain.  Urolithiasis.

CT ABDOMEN AND PELVIS WITHOUT CONTRAST
TECHNIQUE: Multidetector CT imaging of the abdomen and pelvis was
performed following the standard protocol without intravenous
contrast.

[Series 4: mpr coronal (id) · coronal · 0.78mm/px · 8 of 102 slices shown, 9 images]
[im 12/102  soft-tissue]
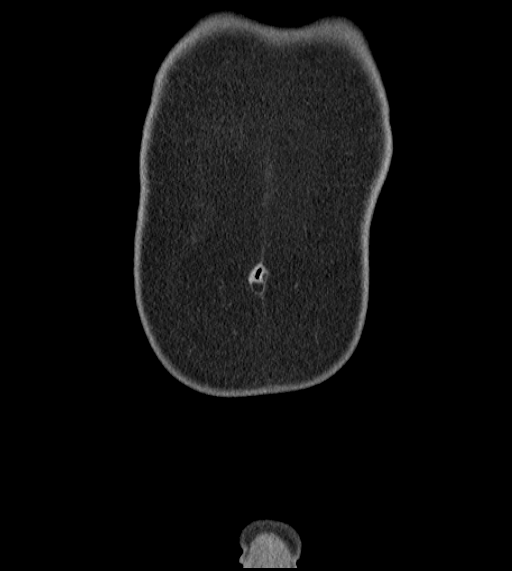
[im 12/102  bone]
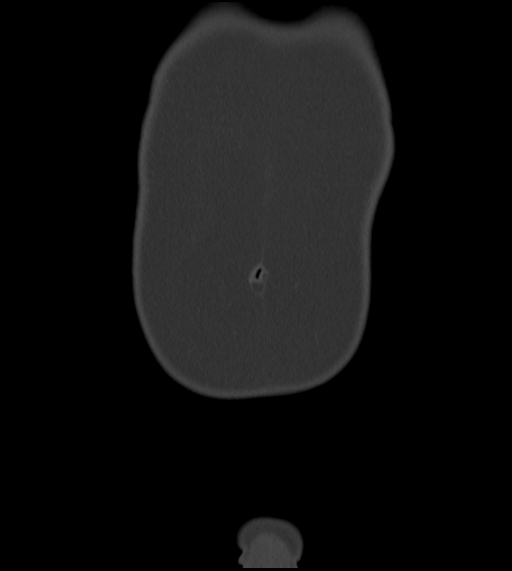
[im 23/102  soft-tissue]
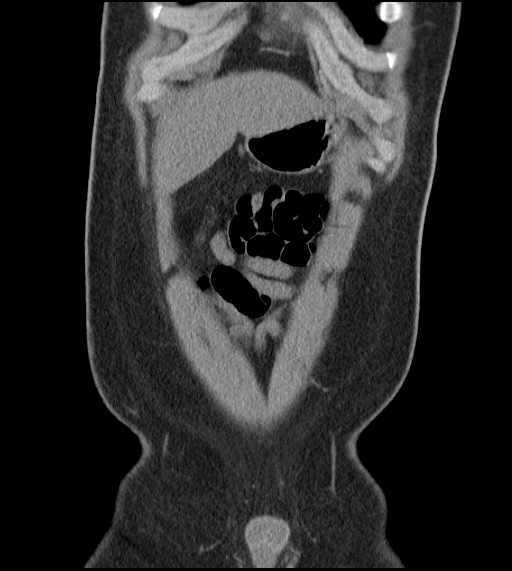
[im 34/102  soft-tissue]
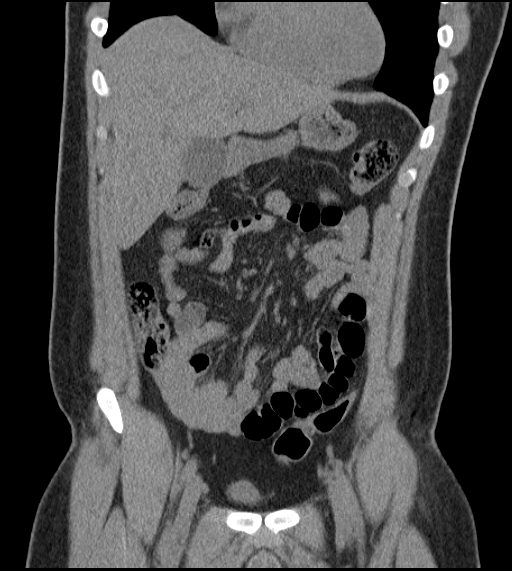
[im 45/102  soft-tissue]
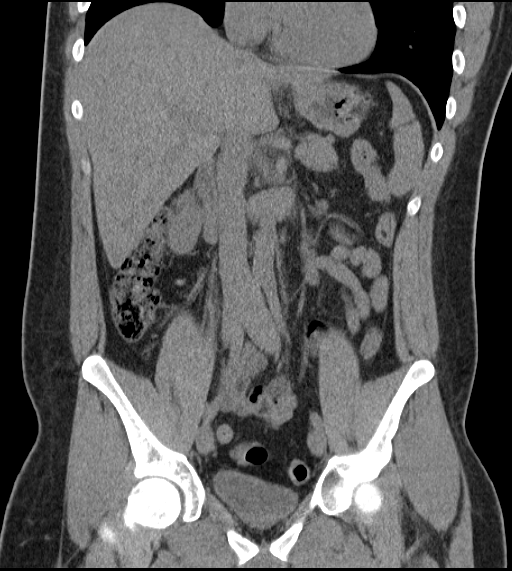
[im 57/102  soft-tissue]
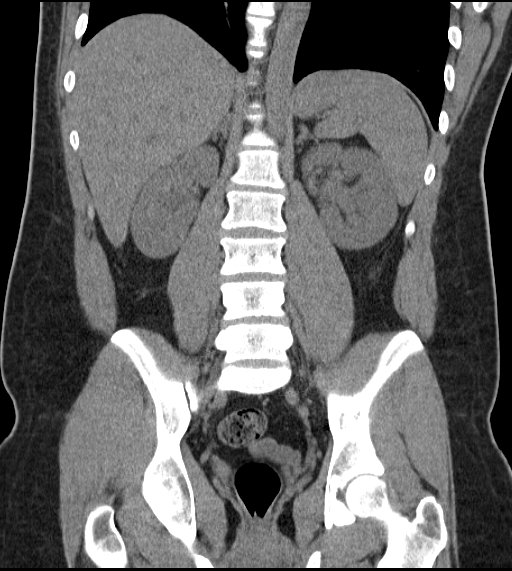
[im 68/102  soft-tissue]
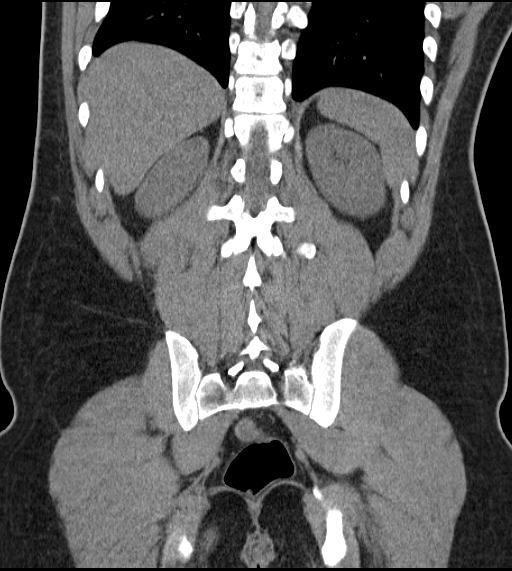
[im 79/102  soft-tissue]
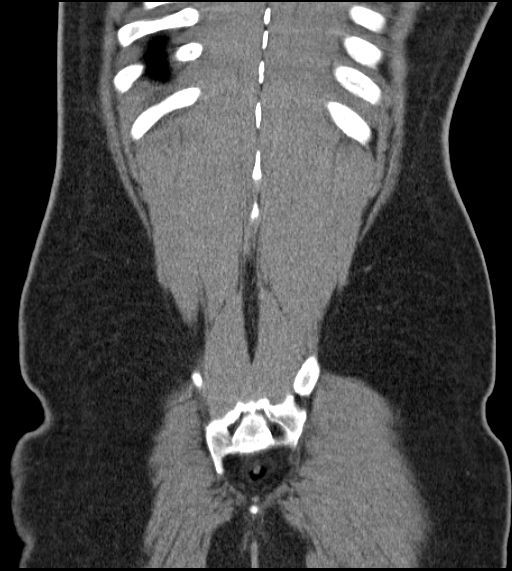
[im 90/102  soft-tissue]
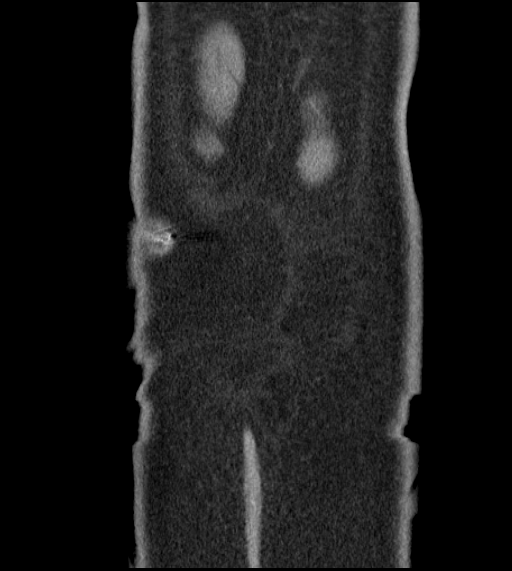

[Series 5: mpr sagittal (id) · sagittal · 0.59mm/px · 1 of 143 slices shown, 2 images]
[im 48/143  soft-tissue]
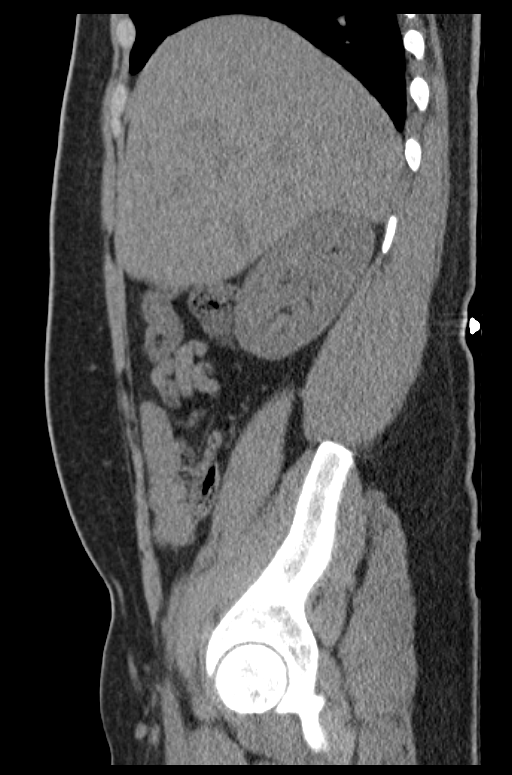
[im 48/143  bone]
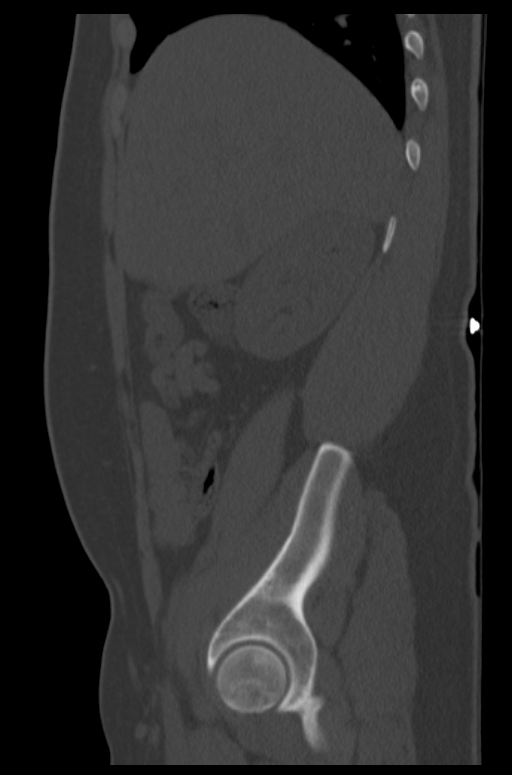

[9 of 46 positions shown; findings below may reference images not displayed]

FINDINGS: No evidence of renal calculi or hydronephrosis.  No
evidence of ureteral calculi or dilatation.  No bladder calculi
identified.

The other abdominal parenchymal organs are also normal in
appearance on this non contrast study.  Gallbladder is
unremarkable.  No soft tissue masses or lymphadenopathy identified.
No evidence of inflammatory process or abnormal fluid collections.
No evidence of dilated bowel loops or hernia.  No suspicious bone
lesions are identified.
IMPRESSION: No evidence of urolithiasis, hydronephrosis, or other acute
findings.

## 2013-10-09 ENCOUNTER — Ambulatory Visit (INDEPENDENT_AMBULATORY_CARE_PROVIDER_SITE_OTHER): Payer: BC Managed Care – PPO | Admitting: Family Medicine

## 2013-10-09 ENCOUNTER — Encounter: Payer: Self-pay | Admitting: Family Medicine

## 2013-10-09 VITALS — BP 128/78 | Ht 66.0 in | Wt 233.0 lb

## 2013-10-09 DIAGNOSIS — F1920 Other psychoactive substance dependence, uncomplicated: Secondary | ICD-10-CM | POA: Insufficient documentation

## 2013-10-09 DIAGNOSIS — R079 Chest pain, unspecified: Secondary | ICD-10-CM

## 2013-10-09 MED ORDER — OXYCODONE HCL 10 MG PO TABS: 10.0000 mg | ORAL_TABLET | ORAL | Status: DC | PRN
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
## 2013-10-10 ENCOUNTER — Encounter: Payer: Self-pay | Admitting: Family Medicine

## 2013-10-10 NOTE — Addendum Note (Signed)
Addended by: Lilyan Punt A on: 10/10/2013 09:13 AM   Modules accepted: Orders

## 2013-10-12 ENCOUNTER — Telehealth: Payer: Self-pay | Admitting: Family Medicine

## 2013-10-12 MED ORDER — OXYCODONE HCL 10 MG PO TABS: 10.0000 mg | ORAL_TABLET | ORAL | Status: DC | PRN

## 2013-10-12 NOTE — Telephone Encounter (Signed)
Pt requesting refill on Oxycodone HCl 10 MG TABS, pt states was given enough for a few days, he is scheduled to see the therapist on 10/15/13, would like Rx to cover him until then. (Dr. Lorin Picket is aware of pt's situation) Please call when ready

## 2013-10-12 NOTE — Telephone Encounter (Signed)
Rx printed and left up front for patient pick up. Patient notified. 

## 2013-10-12 NOTE — Telephone Encounter (Signed)
Patient was given 24 tablets on Tuesday

## 2013-10-12 NOTE — Telephone Encounter (Signed)
Patient may have one refill of #24 per Dr. Lorin Picket

## 2013-10-15 ENCOUNTER — Telehealth: Payer: Self-pay | Admitting: Family Medicine

## 2013-10-15 MED ORDER — OXYCODONE HCL 10 MG PO TABS: 10.0000 mg | ORAL_TABLET | ORAL | Status: DC | PRN

## 2013-10-15 NOTE — Telephone Encounter (Signed)
Patient notified that script ready for pick up.

## 2013-10-15 NOTE — Telephone Encounter (Signed)
He may have a prescription for 60 oxycodone. One every 4 hours as needed. Oxycodone 10.

## 2013-10-15 NOTE — Telephone Encounter (Signed)
Patient does not have an appointment with Dr. Wynonia Lawman until 10/25/13. He went today for his consultation, but they will not give him any medicine until his appointment on 10/25/13. He is requesting that we write him an addition prescription to last him until 10/25/13.

## 2013-10-25 ENCOUNTER — Telehealth: Payer: Self-pay | Admitting: *Deleted

## 2013-10-25 ENCOUNTER — Encounter: Payer: Self-pay | Admitting: Family Medicine

## 2013-10-25 ENCOUNTER — Telehealth: Payer: Self-pay | Admitting: Family Medicine

## 2013-10-25 NOTE — Telephone Encounter (Signed)
Pt has made his appt for today with the office we refered him to, but  They didn't get to see the doc due to they had already met their 100 pt Quota   [facility] will see him on Tuesday, he needs enough strips Till he can get to this appt on Tuesday. You can talk to [facility] at this office to verify everything.   [phone]  Feel free to contact dad if you need to as well

## 2013-10-25 NOTE — Telephone Encounter (Signed)
Father stated that the patient decided he could no afford in patient detox and wants enough pills to last till Sunday -he will stop pills Sun  And Monday and see them Tues in withdrawals. Patient needs #18 pills and needs to be out after that so he will not take at all on Monday and Tues till they see him.

## 2013-10-25 NOTE — Telephone Encounter (Signed)
Spoke with patient's father and advised him that [place] states he has got to be clean or off of percocet for 3-4 days before he can start the seboxone and they rec inpatient detox if patient can not safely stop med on own and that Dr. Lorin Picket rec he take patient to [facility] for evaluation and inpatient detox program. Carney Bern stated he will talk to his son and see if he is willing to go for in patient detox and call us tomm and let us know what they decide to do.

## 2013-10-26 MED ORDER — OXYCODONE HCL 10 MG PO TABS: 10.0000 mg | ORAL_TABLET | ORAL | Status: DC | PRN

## 2013-10-26 NOTE — Telephone Encounter (Signed)
He may have this prescription for 18 tablets. The parents should guide the giving of this medication no greater than 6 per day . I highly advise that if the patient is having significant withdrawals that [facility] and treatment of opioid withdrawal.

## 2013-10-26 NOTE — Addendum Note (Signed)
Addended by: Margaretha SheffieldBROWN, AUTUMN S on: 10/26/2013 09:02 AM   Modules accepted: Orders

## 2013-10-26 NOTE — Telephone Encounter (Signed)
Rx up front for patient pick up. Patient notified. 

## 2015-01-21 ENCOUNTER — Emergency Department (HOSPITAL_COMMUNITY): Payer: Self-pay

## 2015-01-21 ENCOUNTER — Emergency Department (HOSPITAL_COMMUNITY)
Admission: EM | Admit: 2015-01-21 | Discharge: 2015-01-21 | Disposition: A | Discharge status: Home / Self Care | Payer: Self-pay | Attending: Emergency Medicine | Admitting: Emergency Medicine

## 2015-01-21 ENCOUNTER — Encounter (HOSPITAL_COMMUNITY): Payer: Self-pay | Admitting: Emergency Medicine

## 2015-01-21 DIAGNOSIS — Y9289 Other specified places as the place of occurrence of the external cause: Secondary | ICD-10-CM | POA: Insufficient documentation

## 2015-01-21 DIAGNOSIS — S29012A Strain of muscle and tendon of back wall of thorax, initial encounter: Secondary | ICD-10-CM | POA: Insufficient documentation

## 2015-01-21 DIAGNOSIS — Y9339 Activity, other involving climbing, rappelling and jumping off: Secondary | ICD-10-CM | POA: Insufficient documentation

## 2015-01-21 DIAGNOSIS — S29019A Strain of muscle and tendon of unspecified wall of thorax, initial encounter: Secondary | ICD-10-CM

## 2015-01-21 DIAGNOSIS — Y998 Other external cause status: Secondary | ICD-10-CM | POA: Insufficient documentation

## 2015-01-21 DIAGNOSIS — Z87442 Personal history of urinary calculi: Secondary | ICD-10-CM | POA: Insufficient documentation

## 2015-01-21 DIAGNOSIS — W16812A Jumping or diving into other water striking water surface causing other injury, initial encounter: Secondary | ICD-10-CM | POA: Insufficient documentation

## 2015-01-21 MED ORDER — CYCLOBENZAPRINE HCL 10 MG PO TABS: 10.0000 mg | ORAL_TABLET | Freq: Three times a day (TID) | ORAL | Status: DC | PRN

## 2015-01-21 NOTE — Discharge Instructions (Signed)
Ibuprofen 600 mg 3 times daily for the next 5 days. ° °Flexeril as prescribed as needed for pain not relieved with ibuprofen. ° °Followup with your primary Dr. if not improving in the next week. ° ° °Back Pain, Adult °Low back pain is very common. About 1 in 5 people have back pain. The cause of low back pain is rarely dangerous. The pain often gets better over time. About half of people with a sudden onset of back pain feel better in just 2 weeks. About 8 in 10 people feel better by 6 weeks.  °CAUSES °Some common causes of back pain include: °· Strain of the muscles or ligaments supporting the spine. °· Wear and tear (degeneration) of the spinal discs. °· Arthritis. °· Direct injury to the back. °DIAGNOSIS °Most of the time, the direct cause of low back pain is not known. However, back pain can be treated effectively even when the exact cause of the pain is unknown. Answering your caregiver's questions about your overall health and symptoms is one of the most accurate ways to make sure the cause of your pain is not dangerous. If your caregiver needs more information, he or she may order lab work or imaging tests (X-rays or MRIs). However, even if imaging tests show changes in your back, this usually does not require surgery. °HOME CARE INSTRUCTIONS °For many people, back pain returns. Since low back pain is rarely dangerous, it is often a condition that people can learn to manage on their own.  °· Remain active. It is stressful on the back to sit or stand in one place. Do not sit, drive, or stand in one place for more than 30 minutes at a time. Take short walks on level surfaces as soon as pain allows. Try to increase the length of time you walk each day. °· Do not stay in bed. Resting more than 1 or 2 days can delay your recovery. °· Do not avoid exercise or work. Your body is made to move. It is not dangerous to be active, even though your back may hurt. Your back will likely heal faster if you return to being  active before your pain is gone. °· Pay attention to your body when you  bend and lift. Many people have less discomfort when lifting if they bend their knees, keep the load close to their bodies, and avoid twisting. Often, the most comfortable positions are those that put less stress on your recovering back. °· Find a comfortable position to sleep. Use a firm mattress and lie on your side with your knees slightly bent. If you lie on your back, put a pillow under your knees. °· Only take over-the-counter or prescription medicines as directed by your caregiver. Over-the-counter medicines to reduce pain and inflammation are often the most helpful. Your caregiver may prescribe muscle relaxant drugs. These medicines help dull your pain so you can more quickly return to your normal activities and healthy exercise. °· Put ice on the injured area. °¨ Put ice in a plastic bag. °¨ Place a towel between your skin and the bag. °¨ Leave the ice on for 15-20 minutes, 03-04 times a day for the first 2 to 3 days. After that, ice and heat may be alternated to reduce pain and spasms. °· Ask your caregiver about trying back exercises and gentle massage. This may be of some benefit. °· Avoid feeling anxious or stressed. Stress increases muscle tension and can worsen back pain. It is important to recognize when you are anxious or stressed and learn ways to manage it. Exercise is a great option. °SEEK MEDICAL   CARE IF:  You have pain that is not relieved with rest or medicine.  You have pain that does not improve in 1 week.  You have new symptoms.  You are generally not feeling well. SEEK IMMEDIATE MEDICAL CARE IF:   You have pain that radiates from your back into your legs.  You develop new bowel or bladder control problems.  You have unusual weakness or numbness in your arms or legs.  You develop nausea or vomiting.  You develop abdominal pain.  You feel faint. Document Released: 05/03/2005 Document Revised:  11/02/2011 Document Reviewed: 09/04/2013 [facility] Patient Information 2015 [place], [place]. This information is not intended to replace advice given to you by your health care provider. Make sure you discuss any questions you have with your health care provider.
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
## 2015-01-21 NOTE — ED Notes (Signed)
Discharge instructions given, pt demonstrated teach back and verbal understanding. No concerns voiced.  

## 2015-03-04 ENCOUNTER — Emergency Department (HOSPITAL_COMMUNITY)
Admission: EM | Admit: 2015-03-04 | Discharge: 2015-03-04 | Disposition: A | Discharge status: Home / Self Care | Payer: Worker's Compensation | Attending: Emergency Medicine | Admitting: Emergency Medicine

## 2015-03-04 ENCOUNTER — Encounter (HOSPITAL_COMMUNITY): Payer: Self-pay | Admitting: *Deleted

## 2015-03-04 DIAGNOSIS — G8918 Other acute postprocedural pain: Secondary | ICD-10-CM | POA: Diagnosis not present

## 2015-03-04 DIAGNOSIS — Z87442 Personal history of urinary calculi: Secondary | ICD-10-CM | POA: Insufficient documentation

## 2015-03-04 DIAGNOSIS — Z9889 Other specified postprocedural states: Secondary | ICD-10-CM | POA: Insufficient documentation

## 2015-03-04 DIAGNOSIS — M25512 Pain in left shoulder: Secondary | ICD-10-CM | POA: Insufficient documentation

## 2015-03-04 MED ORDER — HYDROMORPHONE HCL 2 MG/ML IJ SOLN
2.0000 mg | Freq: Once | INTRAMUSCULAR | Status: AC
  Administered 2015-03-04: 2 mg via INTRAMUSCULAR
  Filled 2015-03-04: qty 1

## 2015-03-04 MED ORDER — HYDROXYZINE HCL 50 MG/ML IM SOLN
50.0000 mg | Freq: Once | INTRAMUSCULAR | Status: AC
  Administered 2015-03-04: 50 mg via INTRAMUSCULAR
  Filled 2015-03-04: qty 1

## 2015-03-04 NOTE — ED Notes (Signed)
Had surgery on left shoulder yesterday, severe post op pain uncontrolled by 7.5mg  Percocet-last dose at 1630

## 2015-03-04 NOTE — ED Provider Notes (Signed)
CSN: [id number]     Arrival date & time 03/04/15  2051 History   First MD Initiated Contact with Patient 03/04/15 2112     Chief Complaint  Patient presents with  . Post-op Problem     (Consider location/radiation/quality/duration/timing/severity/associated sxs/prior Treatment) Patient is a 24 y.o. male presenting with shoulder pain. The history is provided by the patient.  Shoulder Pain Location:  Shoulder Shoulder location:  L shoulder Pain details:    Severity:  Severe   Timing:  Constant Prior injury to area:  Yes  John Brock is a 24 y.o. male who presents to the ED with his mother for left shoulder pain. Patient had surgery yesterday for torn rotator cuff and other injuries s/p fall. He was prescribed  Percocet 7.5 mg by Dr. Eulah PontMurphy which is not controlling his pain.  He states he was a former drug user and had taken as much as 180 mg of oxycodone daily at one point. He reports having been clean for 3 months prior to injuring his shoulder and requiring surgery yesterday. He states he told the doctor that he would need stronger medication but he felt like the percocet 7.5 would manage the pain.   Past Medical History  Diagnosis Date  . Kidney stone    Past Surgical History  Procedure Laterality Date  . Anterior cruciate ligament repair     History reviewed. No pertinent family history. Social History  Substance Use Topics  . Smoking status: Never Smoker   . Smokeless tobacco: Current User    Types: Chew  . Alcohol Use: Yes    Review of Systems  Musculoskeletal:       Shoulder pain  all other systems negative    Allergies  Review of patient's allergies indicates no known allergies.  Home Medications   Prior to Admission medications   Medication Sig Start Date End Date Taking? Authorizing Provider  cyclobenzaprine (FLEXERIL) 5 MG tablet Take 5 mg by mouth daily as needed. For muscle spasms 02/12/15  Yes Historical Provider, MD  oxyCODONE-acetaminophen  (PERCOCET) 7.5-325 MG tablet Take 1 tablet by mouth 2 (two) times daily as needed. For pain 02/28/15  Yes Historical Provider, MD   BP 135/89 mmHg  Pulse 98  Temp(Src) 98.6 F (37 C) (Oral)  Resp 24  Ht 5\' 7"  (1.702 m)  Wt 200 lb (90.719 kg)  BMI 31.32 kg/m2  SpO2 97% Physical Exam  Constitutional: He is oriented to person, place, and time. He appears well-developed and well-nourished.  HENT:  Head: Normocephalic.  Eyes: EOM are normal.  Neck: Neck supple.  Cardiovascular: Normal rate.   Pulmonary/Chest: Effort normal.  Musculoskeletal:  Large bandage in place left shoulder. Radial pulse 2+, adequate circulation, good touch sensation, good strength. Patient can move fingers and touch each finger to the thumb.   Neurological: He is alert and oriented to person, place, and time. No cranial nerve deficit.  Skin: Skin is warm and dry.  Psychiatric: He has a normal mood and affect. His behavior is normal.  Nursing note and vitals reviewed.   ED Course  Procedures Patient given Dilaudid 2 mg IM Patient states dilaudid did not touch the pain Discussed with Dr. Estell HarpinZammit and will give vistaril 50 mg IM and d/c home to follow up with Dr. Eulah PontMurphy.  MDM  24 y.o. male with left shoulder pain s/p surgery yesterday. Stable for d/c without focal neuro deficits. Discussed with the patient and all questioned fully answered. He will call Dr. Eulah PontMurphy  in the morning for follow up.  Final diagnoses:  Left shoulder pain       Janne Napoleon, NP 03/04/15 7829  Bethann Berkshire, MD 03/04/15 [phone]

## 2015-03-04 NOTE — Discharge Instructions (Signed)
We have treated your pain tonight but you will need to call Dr. Eulah PontMurphy in the morning for any additional pain management.

## 2016-06-28 IMAGING — CT CT T SPINE W/O CM
3 series · 9 of 33 positions shown, 11 images · non-contrast
Comparison: None.

CLINICAL DATA: Acute back and lower extremity pain after jumping
off [place].

EXAM:
CT THORACIC SPINE WITHOUT CONTRAST
TECHNIQUE: Multidetector CT imaging of the thoracic spine was performed without
intravenous contrast administration. Multiplanar CT image
reconstructions were also generated.

[Series 3: thoracic spine 2.0 b30s · axial · 0.33mm/px · z∈[+1022,+1022]mm · 1 of 158 slices shown, 2 images]
[im 85/158  soft-tissue]
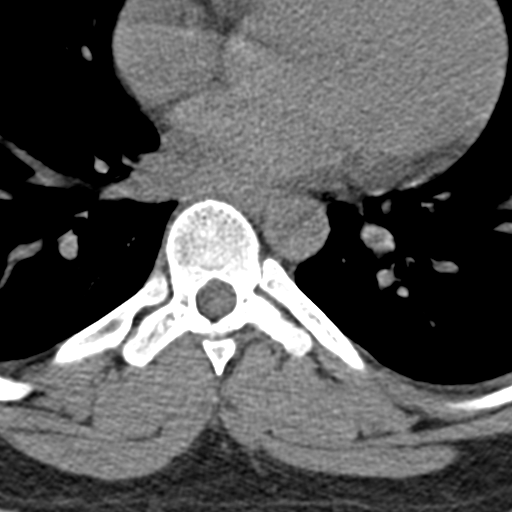
[im 85/158  bone]
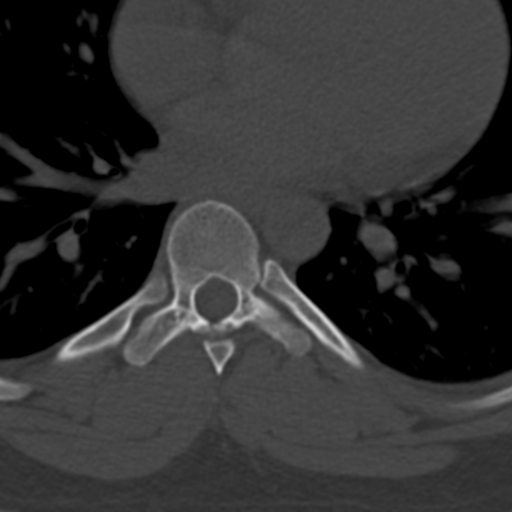

[Series 5: thoracic spine 2.0 spo cor · coronal · 0.30mm/px · 3 of 64 slices shown]
[im 13/64  bone]
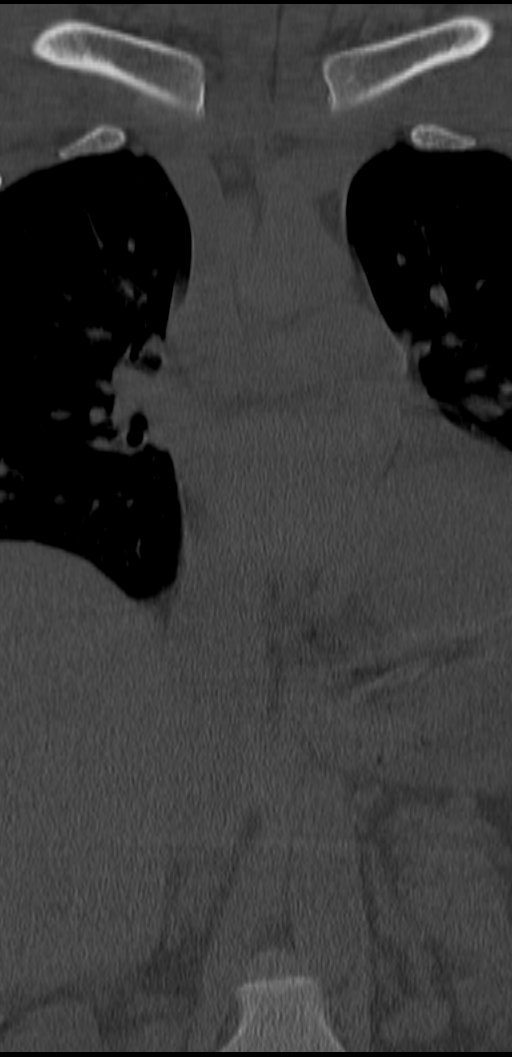
[im 26/64  bone]
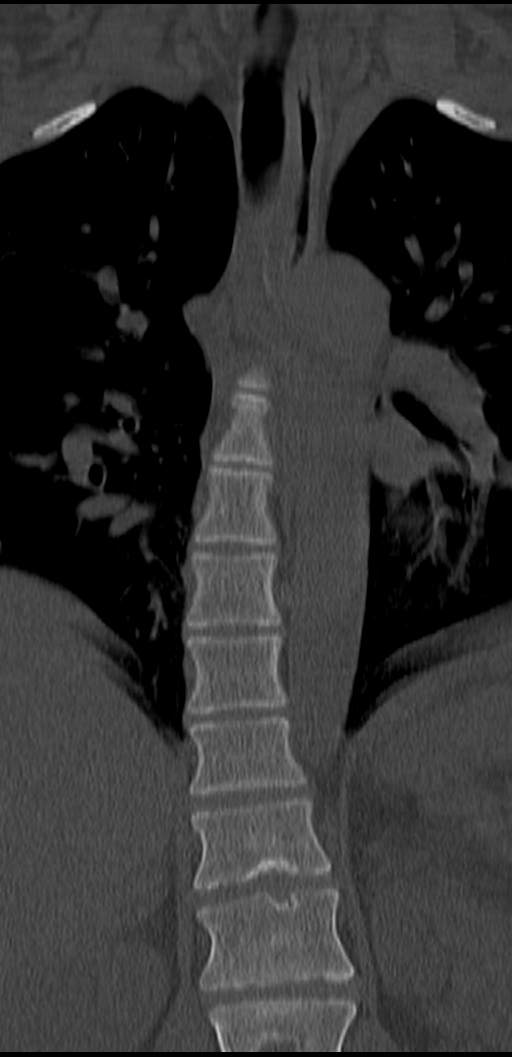
[im 38/64  bone]
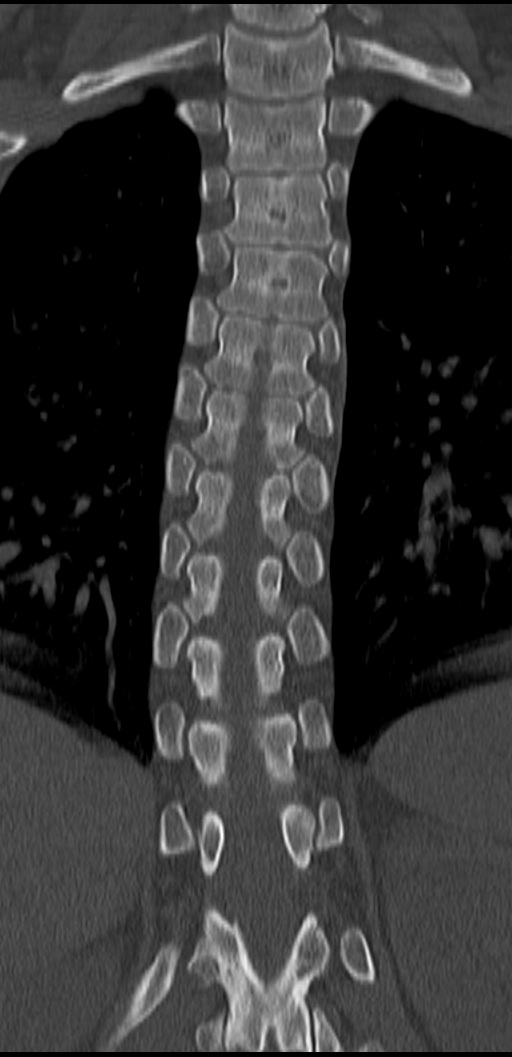

[Series 6: thoracic spine 2.0 spo sag · sagittal · 0.32mm/px · 5 of 78 slices shown, 6 images]
[im 26/78  bone]
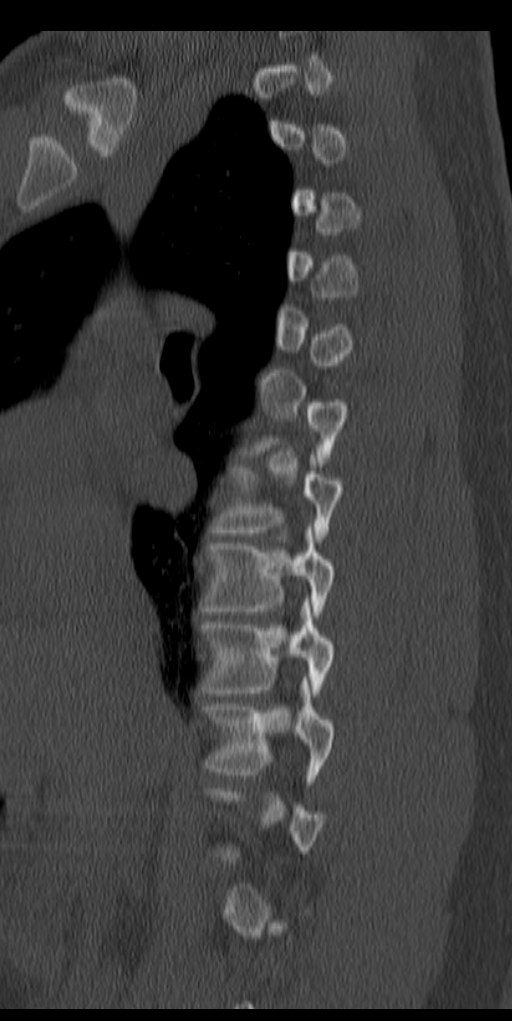
[im 33/78  bone]
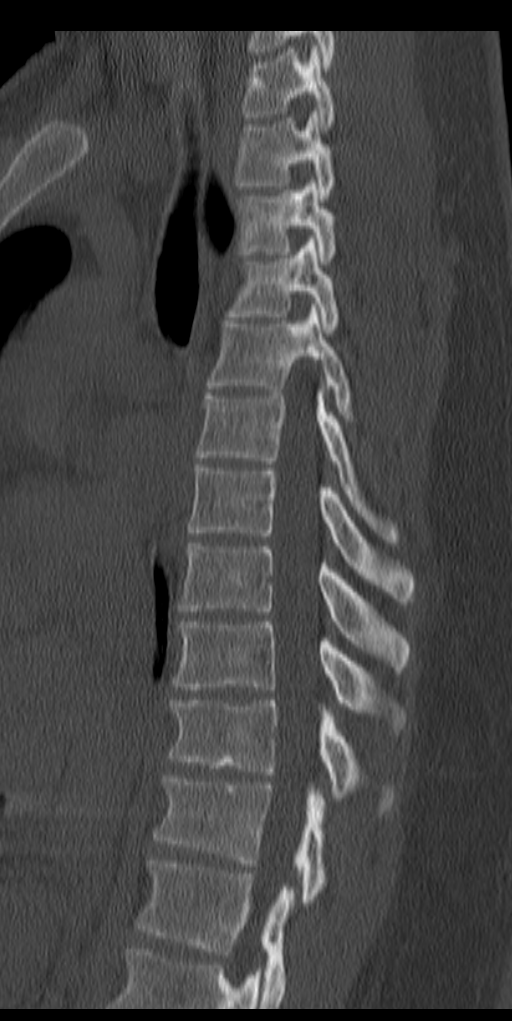
[im 39/78  soft-tissue]
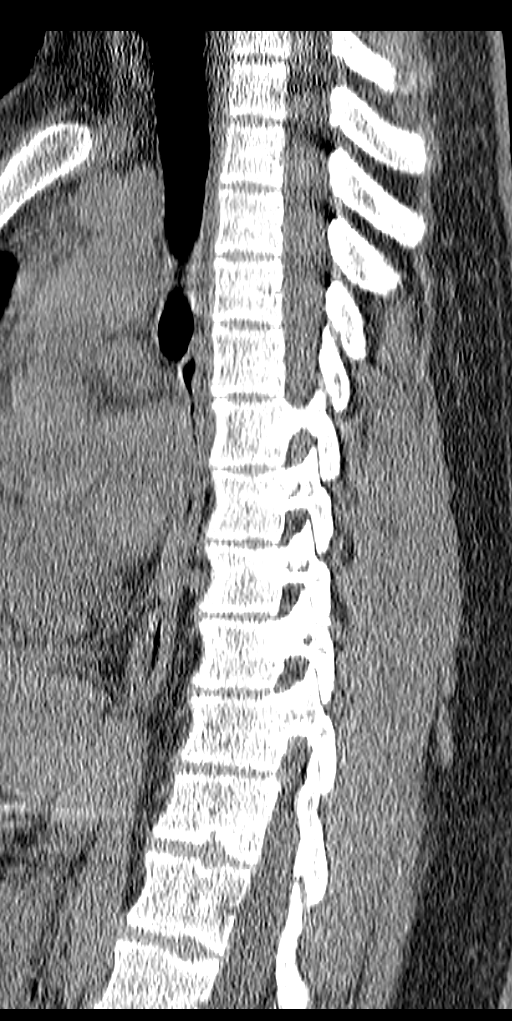
[im 39/78  bone]
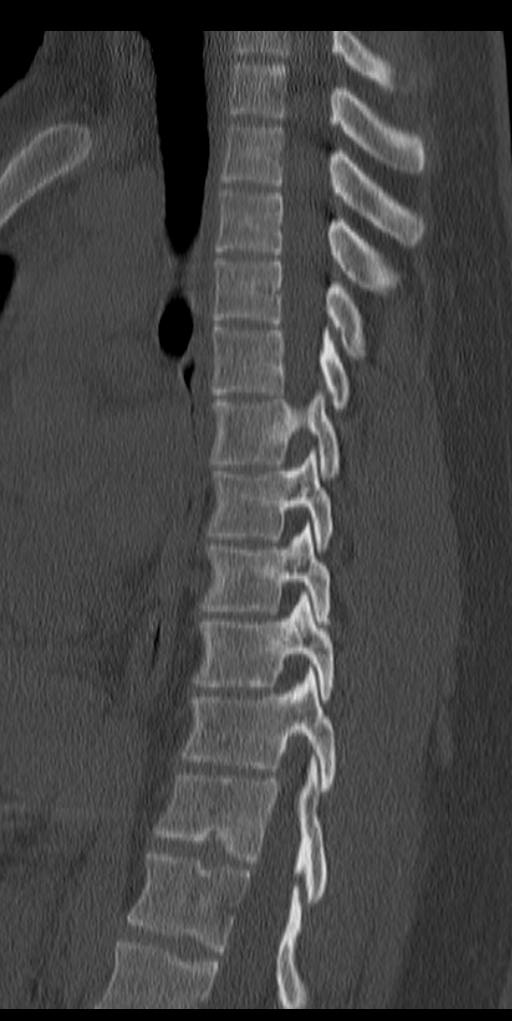
[im 45/78  bone]
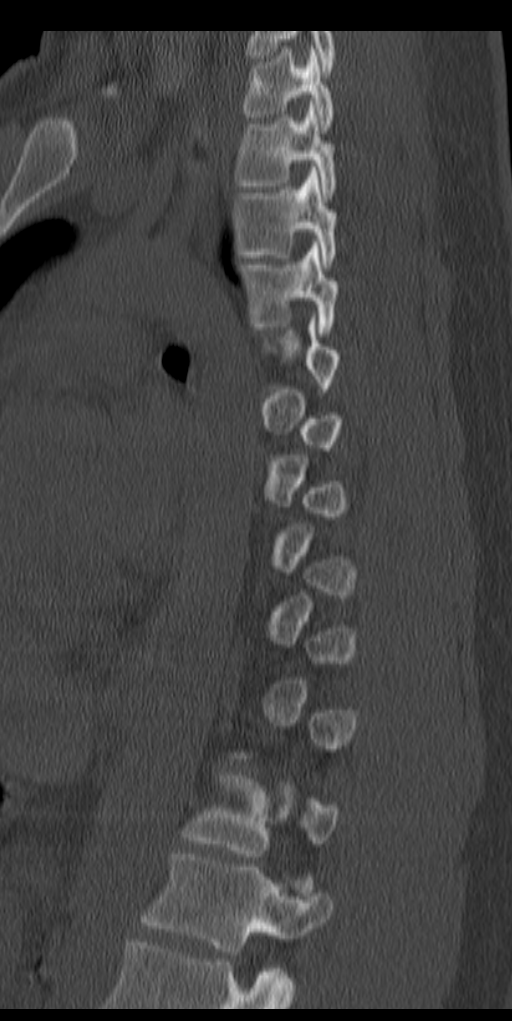
[im 52/78  bone]
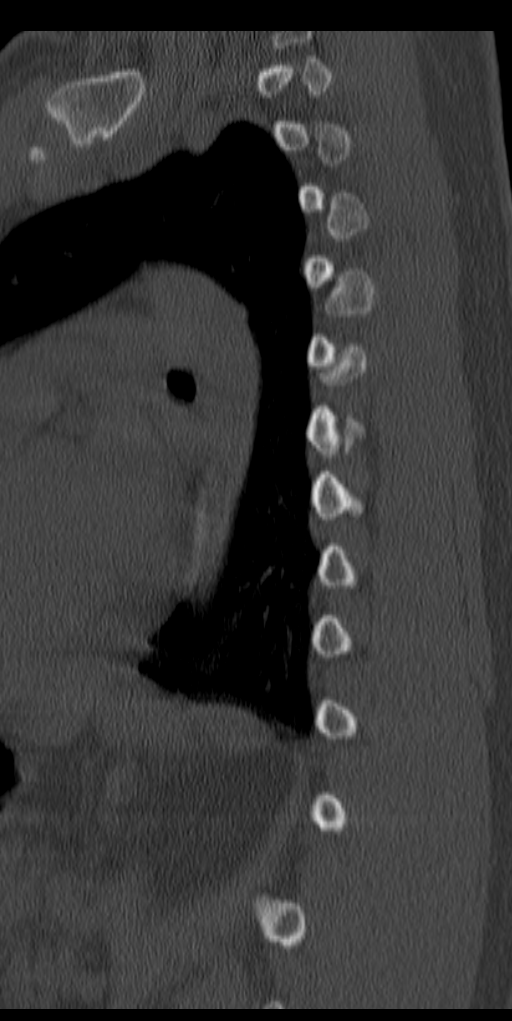

[9 of 33 positions shown; findings below may reference images not displayed]

FINDINGS: Mild dextroscoliosis of lower thoracic spine is noted. No fracture
or spondylolisthesis is noted. Disc spaces appear to be well
maintained. Visualized lung fields appear normal. No definite
abnormality seen within the central spinal canal.
IMPRESSION: Mild dextroscoliosis of lower thoracic spine. No fracture or
dislocation is seen.

## 2018-05-25 ENCOUNTER — Telehealth: Payer: Self-pay | Admitting: Family Medicine

## 2018-05-25 NOTE — Telephone Encounter (Signed)
Last visit 10/09/2013 with Dr.Scott.   Pt requesting to be seen for sinus drainage/cough- x 2 days no fever

## 2018-05-25 NOTE — Telephone Encounter (Signed)
This patient may have appointment thank you

## 2018-05-25 NOTE — Telephone Encounter (Signed)
Pt coming in at 11 tomorrow 05/26/18

## 2018-05-26 ENCOUNTER — Ambulatory Visit: Payer: Self-pay | Admitting: Family Medicine

## 2018-08-04 ENCOUNTER — Other Ambulatory Visit: Payer: Self-pay | Admitting: Family Medicine

## 2018-08-04 ENCOUNTER — Telehealth: Payer: Self-pay | Admitting: Family Medicine

## 2018-08-04 MED ORDER — BENZONATATE 100 MG PO CAPS: 100.0000 mg | ORAL_CAPSULE | Freq: Three times a day (TID) | ORAL | 0 refills | Status: DC | PRN

## 2018-08-04 MED ORDER — AMOXICILLIN 500 MG PO TABS: 500.0000 mg | ORAL_TABLET | Freq: Three times a day (TID) | ORAL | 0 refills | Status: DC

## 2018-08-04 NOTE — Telephone Encounter (Signed)
Patient states he's experiencing sinus congested, and cough, no fever, no vomiting or nausea, would like medication called in if possible, advise.  Patient has not been in contact with anyone with possible COVID-19 or sick that he's aware of.  Patient has not traveled internationally in last 30 days.  [facility], [place]

## 2018-08-04 NOTE — Telephone Encounter (Signed)
Pt.notified

## 2018-08-04 NOTE — Telephone Encounter (Signed)
Antibiotic was sent into [facility] We will not be sending in [place] I sent in [place] Patient should gradually get better if ongoing troubles or worse call back

## 2018-08-04 NOTE — Telephone Encounter (Signed)
Monday night started with a cough now coughing up clear to white mucus, pretty bad cough, head congestion not terrible, no fever, chest tight but he would not say he is sob of breath. Would like hycodan cough syrup   [facility]

## 2018-11-04 ENCOUNTER — Encounter (HOSPITAL_COMMUNITY): Payer: Self-pay | Admitting: Emergency Medicine

## 2018-11-04 ENCOUNTER — Emergency Department (HOSPITAL_COMMUNITY)
Admission: EM | Admit: 2018-11-04 | Discharge: 2018-11-05 | Disposition: A | Discharge status: Home / Self Care | Payer: Self-pay | Attending: Emergency Medicine | Admitting: Emergency Medicine

## 2018-11-04 ENCOUNTER — Other Ambulatory Visit: Payer: Self-pay

## 2018-11-04 DIAGNOSIS — Z79899 Other long term (current) drug therapy: Secondary | ICD-10-CM | POA: Insufficient documentation

## 2018-11-04 DIAGNOSIS — F17220 Nicotine dependence, chewing tobacco, uncomplicated: Secondary | ICD-10-CM | POA: Insufficient documentation

## 2018-11-04 DIAGNOSIS — I10 Essential (primary) hypertension: Secondary | ICD-10-CM | POA: Insufficient documentation

## 2018-11-04 DIAGNOSIS — R1012 Left upper quadrant pain: Secondary | ICD-10-CM | POA: Insufficient documentation

## 2018-11-04 DIAGNOSIS — K852 Alcohol induced acute pancreatitis without necrosis or infection: Secondary | ICD-10-CM | POA: Insufficient documentation

## 2018-11-04 NOTE — ED Triage Notes (Signed)
Pt c/o LUQ pain that started x1 week ago. States that the pain is worse when he eats and drinks ETOH.

## 2018-11-05 ENCOUNTER — Other Ambulatory Visit: Payer: Self-pay

## 2018-11-05 ENCOUNTER — Emergency Department (HOSPITAL_COMMUNITY): Payer: Self-pay

## 2018-11-05 LAB — COMPREHENSIVE METABOLIC PANEL
ALT: 41 U/L (ref 0–44)
AST: 22 U/L (ref 15–41)
Albumin: 4.3 g/dL (ref 3.5–5.0)
Alkaline Phosphatase: 63 U/L (ref 38–126)
Anion gap: 8 (ref 5–15)
BUN: 10 mg/dL (ref 6–20)
CO2: 25 mmol/L (ref 22–32)
Calcium: 8.6 mg/dL — ABNORMAL LOW (ref 8.9–10.3)
Chloride: 104 mmol/L (ref 98–111)
Creatinine, Ser: 0.81 mg/dL (ref 0.61–1.24)
GFR calc Af Amer: >60 mL/min (ref >60)
GFR calc non Af Amer: >60 mL/min (ref >60)
Glucose, Bld: 96 mg/dL (ref 70–99)
Potassium: 4.2 mmol/L (ref 3.5–5.1)
Sodium: 137 mmol/L (ref 135–145)
Total Bilirubin: 0.6 mg/dL (ref 0.3–1.2)
Total Protein: 6.9 g/dL (ref 6.5–8.1)

## 2018-11-05 LAB — URINALYSIS, ROUTINE W REFLEX MICROSCOPIC
Bilirubin Urine: NEGATIVE
Glucose, UA: NEGATIVE mg/dL
Hgb urine dipstick: NEGATIVE
Ketones, ur: NEGATIVE mg/dL
Leukocytes,Ua: NEGATIVE
Nitrite: NEGATIVE
Protein, ur: NEGATIVE mg/dL
Specific Gravity, Urine: 1.013 (ref 1.005–1.030)
pH: 6 (ref 5.0–8.0)

## 2018-11-05 LAB — CBC WITH DIFFERENTIAL/PLATELET
Abs Immature Granulocytes: 0.06 10*3/uL (ref 0.00–0.07)
Basophils Absolute: 0.1 10*3/uL (ref 0.0–0.1)
Basophils Relative: 1 %
Eosinophils Absolute: 0.2 10*3/uL (ref 0.0–0.5)
Eosinophils Relative: 2 %
HCT: 46.7 % (ref 39.0–52.0)
Hemoglobin: 15.5 g/dL (ref 13.0–17.0)
Immature Granulocytes: 1 %
Lymphocytes Relative: 18 %
Lymphs Abs: 2.3 10*3/uL (ref 0.7–4.0)
MCH: 30.9 pg (ref 26.0–34.0)
MCHC: 33.2 g/dL (ref 30.0–36.0)
MCV: 93.2 fL (ref 80.0–100.0)
Monocytes Absolute: 1.3 10*3/uL — ABNORMAL HIGH (ref 0.1–1.0)
Monocytes Relative: 11 %
Neutro Abs: 8.5 10*3/uL — ABNORMAL HIGH (ref 1.7–7.7)
Neutrophils Relative %: 67 %
Platelets: 201 10*3/uL (ref 150–400)
RBC: 5.01 MIL/uL (ref 4.22–5.81)
RDW: 13.4 % (ref 11.5–15.5)
WBC: 12.4 10*3/uL — ABNORMAL HIGH (ref 4.0–10.5)
nRBC: 0 % (ref 0.0–0.2)

## 2018-11-05 LAB — LIPASE, BLOOD: Lipase: 130 U/L — ABNORMAL HIGH (ref 11–51)

## 2018-11-05 LAB — ETHANOL: Alcohol, Ethyl (B): <10 mg/dL (ref <10)

## 2018-11-05 MED ORDER — SODIUM CHLORIDE 0.9 % IV BOLUS
1000.0000 mL | Freq: Once | INTRAVENOUS | Status: AC
  Administered 2018-11-05: 01:00:00 1000 mL via INTRAVENOUS

## 2018-11-05 MED ORDER — ONDANSETRON HCL 4 MG/2ML IJ SOLN
4.0000 mg | Freq: Once | INTRAMUSCULAR | Status: AC
  Administered 2018-11-05: 01:00:00 4 mg via INTRAVENOUS
  Filled 2018-11-05: qty 2

## 2018-11-05 MED ORDER — ALUM & MAG HYDROXIDE-SIMETH 200-200-20 MG/5ML PO SUSP
30.0000 mL | Freq: Once | ORAL | Status: AC
  Administered 2018-11-05: 01:00:00 30 mL via ORAL
  Filled 2018-11-05: qty 30

## 2018-11-05 MED ORDER — LIDOCAINE VISCOUS HCL 2 % MT SOLN
15.0000 mL | Freq: Once | OROMUCOSAL | Status: AC
  Administered 2018-11-05: 01:00:00 15 mL via ORAL
  Filled 2018-11-05: qty 15

## 2018-11-05 MED ORDER — IOHEXOL 300 MG/ML  SOLN
100.0000 mL | Freq: Once | INTRAMUSCULAR | Status: AC | PRN
  Administered 2018-11-05: 02:00:00 100 mL via INTRAVENOUS

## 2018-11-05 MED ORDER — ONDANSETRON 4 MG PO TBDP: 4.0000 mg | ORAL_TABLET | Freq: Three times a day (TID) | ORAL | 0 refills | Status: DC | PRN
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
## 2018-11-05 NOTE — Discharge Instructions (Addendum)
Your testing today showed a couple of things that I am concerned about, first it appears that you have pancreatitis and though your symptoms are persistent your CT scan does not show any severe findings and your lab work is reassuring.  That being said if you develop increasing pain vomiting or fever you should come back to the emergency department The only way to treat pancreatitis is to stop drinking alcohol altogether and to have a clear liquid diet for the next 48 hours, juice, soda, water, Gatorade and then gradually progress into more solid foods If you try to eat normal foods this may make your pain worse. We do occasionally admit people to the hospital when I have severe pain or vomiting so return should your symptoms worsen despite these above measures Additionally you have evidence of high blood pressure today.  We generally do not treat high blood pressure in the emergency department on young people on an isolated blood pressure measurement however you should have your family doctor recheck your blood pressure within the next 2 weeks and if it is elevated you may need to be started on medications.  Avoiding over-the-counter medicine such as ibuprofen and alcohol will help to lower your blood pressure.  If you need to take something for pain I would suggest Tylenol, 500 mg every 6 hours as needed. Thank you for letting us take care of you today!  Please obtain all of your results from medical records or have your doctors office obtain the results - share them with your doctor - you should be seen at your doctors office in the next 2 days. Call today to arrange your follow up. Take the medications as suggested as long as you are not allergic to them. ?  It is also a possibility that you have an allergic reaction to any of the medicines that you have been prescribed - Everybody reacts differently to medications and while MOST people have no trouble with most medicines, you may have a reaction such  as nausea, vomiting, rash, swelling, shortness of breath. If this is the case, please stop taking the medicine immediately and contact your physician.   If you were given a medication in the ED such as percocet, vicodin, or morphine, be aware that these medicines are sedating and may change your ability to take care of yourself adequately for several hours after being given this medicines - you should not drive or take care of small children if you were given this medicine in the Emergency Department or if you have been prescribed these types of medicines. ?   You should return to the ER IMMEDIATELY if you develop severe or worsening symptoms.

## 2019-03-16 ENCOUNTER — Other Ambulatory Visit: Payer: Self-pay

## 2019-03-16 ENCOUNTER — Encounter (HOSPITAL_COMMUNITY): Payer: Self-pay

## 2019-03-16 ENCOUNTER — Emergency Department (HOSPITAL_COMMUNITY)
Admission: EM | Admit: 2019-03-16 | Discharge: 2019-03-16 | Disposition: A | Discharge status: Home / Self Care | Payer: Self-pay | Attending: Emergency Medicine | Admitting: Emergency Medicine

## 2019-03-16 DIAGNOSIS — R310 Gross hematuria: Secondary | ICD-10-CM | POA: Insufficient documentation

## 2019-03-16 DIAGNOSIS — F17220 Nicotine dependence, chewing tobacco, uncomplicated: Secondary | ICD-10-CM | POA: Insufficient documentation

## 2019-03-16 DIAGNOSIS — R112 Nausea with vomiting, unspecified: Secondary | ICD-10-CM | POA: Insufficient documentation

## 2019-03-16 DIAGNOSIS — R109 Unspecified abdominal pain: Secondary | ICD-10-CM | POA: Insufficient documentation

## 2019-03-16 DIAGNOSIS — Z79899 Other long term (current) drug therapy: Secondary | ICD-10-CM | POA: Insufficient documentation

## 2019-03-16 DIAGNOSIS — R197 Diarrhea, unspecified: Secondary | ICD-10-CM | POA: Insufficient documentation

## 2019-03-16 LAB — COMPREHENSIVE METABOLIC PANEL
ALT: 49 U/L — ABNORMAL HIGH (ref 0–44)
AST: 26 U/L (ref 15–41)
Albumin: 4.9 g/dL (ref 3.5–5.0)
Alkaline Phosphatase: 62 U/L (ref 38–126)
Anion gap: 6 (ref 5–15)
BUN: 17 mg/dL (ref 6–20)
CO2: 26 mmol/L (ref 22–32)
Calcium: 9 mg/dL (ref 8.9–10.3)
Chloride: 107 mmol/L (ref 98–111)
Creatinine, Ser: 0.96 mg/dL (ref 0.61–1.24)
GFR calc Af Amer: >60 mL/min (ref >60)
GFR calc non Af Amer: >60 mL/min (ref >60)
Glucose, Bld: 136 mg/dL — ABNORMAL HIGH (ref 70–99)
Potassium: 4.6 mmol/L (ref 3.5–5.1)
Sodium: 139 mmol/L (ref 135–145)
Total Bilirubin: 0.8 mg/dL (ref 0.3–1.2)
Total Protein: 8.2 g/dL — ABNORMAL HIGH (ref 6.5–8.1)

## 2019-03-16 LAB — URINALYSIS, ROUTINE W REFLEX MICROSCOPIC
Bilirubin Urine: NEGATIVE
Glucose, UA: NEGATIVE mg/dL
Ketones, ur: 5 mg/dL — AB
Leukocytes,Ua: NEGATIVE
Nitrite: NEGATIVE
Protein, ur: 100 mg/dL — AB
RBC / HPF: >50 RBC/hpf — ABNORMAL HIGH (ref 0–5)
Specific Gravity, Urine: 1.029 (ref 1.005–1.030)
pH: 5 (ref 5.0–8.0)

## 2019-03-16 LAB — CBC
HCT: 48.9 % (ref 39.0–52.0)
Hemoglobin: 15.8 g/dL (ref 13.0–17.0)
MCH: 30.8 pg (ref 26.0–34.0)
MCHC: 32.3 g/dL (ref 30.0–36.0)
MCV: 95.3 fL (ref 80.0–100.0)
Platelets: 203 10*3/uL (ref 150–400)
RBC: 5.13 MIL/uL (ref 4.22–5.81)
RDW: 13.4 % (ref 11.5–15.5)
WBC: 15.1 10*3/uL — ABNORMAL HIGH (ref 4.0–10.5)
nRBC: 0 % (ref 0.0–0.2)

## 2019-03-16 LAB — LIPASE, BLOOD: Lipase: 32 U/L (ref 11–51)

## 2019-03-16 MED ORDER — SODIUM CHLORIDE 0.9% FLUSH: 3.0000 mL | Freq: Once | INTRAVENOUS | Status: DC

## 2019-03-16 MED ORDER — TAMSULOSIN HCL 0.4 MG PO CAPS: 0.4000 mg | ORAL_CAPSULE | Freq: Every day | ORAL | 0 refills | Status: DC

## 2019-03-16 MED ORDER — IBUPROFEN 600 MG PO TABS: 600.0000 mg | ORAL_TABLET | Freq: Four times a day (QID) | ORAL | 0 refills | Status: DC | PRN

## 2019-03-16 MED ORDER — ONDANSETRON HCL 4 MG PO TABS: 4.0000 mg | ORAL_TABLET | Freq: Three times a day (TID) | ORAL | 0 refills | Status: DC | PRN

## 2019-03-16 NOTE — Discharge Instructions (Signed)
1. Medications: Alternate 600 mg of ibuprofen and 500-1000 mg of Tylenol every 3 hours as needed for pain. Do not exceed 4000 mg of Tylenol daily.  Take ibuprofen with food to avoid upset stomach issues.  Take Zofran as needed for nausea.  Let this medicine dissolve under your tongue and wait around 10-15 minutes before eating or drinking after taking this medication to give it time to work.  Take Flomax as prescribed.  This medicine will open up the ureter(the tube connecting the bladder to the kidney) to allow for easier passage of the kidney stone.  Be aware this medication can cause a drop in your blood pressure so be careful when going from laying or sitting to standing as you may feel lightheaded.  If you feel very fatigued, lightheaded, or have persistently low blood pressures, stop taking this medication entirely. °2. Treatment: rest, drink plenty of fluids.  °3. Follow Up: Please followup with urology as soon as possible (preferably this week) for discussion of your diagnoses and further evaluation after today's visit; Please return to the ER for persistent vomiting, high fevers or worsening symptoms ° °

## 2019-03-16 NOTE — ED Triage Notes (Signed)
Pt presents to ED with complaints of right side abdominal pain which started this am. Pt also reports vomiting and diarrhea.
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
## 2019-04-25 ENCOUNTER — Emergency Department (HOSPITAL_COMMUNITY)
Admission: EM | Admit: 2019-04-25 | Discharge: 2019-04-25 | Disposition: A | Discharge status: Home / Self Care | Payer: Self-pay | Attending: Emergency Medicine | Admitting: Emergency Medicine

## 2019-04-25 ENCOUNTER — Encounter (HOSPITAL_COMMUNITY): Payer: Self-pay | Admitting: *Deleted

## 2019-04-25 ENCOUNTER — Emergency Department (HOSPITAL_COMMUNITY): Payer: Self-pay

## 2019-04-25 ENCOUNTER — Other Ambulatory Visit: Payer: Self-pay

## 2019-04-25 DIAGNOSIS — Z20828 Contact with and (suspected) exposure to other viral communicable diseases: Secondary | ICD-10-CM | POA: Insufficient documentation

## 2019-04-25 DIAGNOSIS — F17200 Nicotine dependence, unspecified, uncomplicated: Secondary | ICD-10-CM | POA: Insufficient documentation

## 2019-04-25 DIAGNOSIS — F17220 Nicotine dependence, chewing tobacco, uncomplicated: Secondary | ICD-10-CM | POA: Insufficient documentation

## 2019-04-25 DIAGNOSIS — B349 Viral infection, unspecified: Secondary | ICD-10-CM | POA: Insufficient documentation

## 2019-04-25 DIAGNOSIS — R0600 Dyspnea, unspecified: Secondary | ICD-10-CM | POA: Insufficient documentation

## 2019-04-25 DIAGNOSIS — R079 Chest pain, unspecified: Secondary | ICD-10-CM | POA: Insufficient documentation

## 2019-04-25 LAB — BASIC METABOLIC PANEL
Anion gap: 11 (ref 5–15)
BUN: 16 mg/dL (ref 6–20)
CO2: 22 mmol/L (ref 22–32)
Calcium: 8.9 mg/dL (ref 8.9–10.3)
Chloride: 102 mmol/L (ref 98–111)
Creatinine, Ser: 0.79 mg/dL (ref 0.61–1.24)
GFR calc Af Amer: >60 mL/min (ref >60)
GFR calc non Af Amer: >60 mL/min (ref >60)
Glucose, Bld: 87 mg/dL (ref 70–99)
Potassium: 3.9 mmol/L (ref 3.5–5.1)
Sodium: 135 mmol/L (ref 135–145)

## 2019-04-25 LAB — POC SARS CORONAVIRUS 2 AG -  ED: SARS Coronavirus 2 Ag: NEGATIVE

## 2019-04-25 LAB — CBC
HCT: 48.9 % (ref 39.0–52.0)
Hemoglobin: 16.1 g/dL (ref 13.0–17.0)
MCH: 30.6 pg (ref 26.0–34.0)
MCHC: 32.9 g/dL (ref 30.0–36.0)
MCV: 93 fL (ref 80.0–100.0)
Platelets: 206 10*3/uL (ref 150–400)
RBC: 5.26 MIL/uL (ref 4.22–5.81)
RDW: 13.2 % (ref 11.5–15.5)
WBC: 9.6 10*3/uL (ref 4.0–10.5)
nRBC: 0 % (ref 0.0–0.2)

## 2019-04-25 LAB — D-DIMER, QUANTITATIVE: D-Dimer, Quant: <0.27 ug/mL-FEU (ref 0.00–0.50)

## 2019-04-25 LAB — TROPONIN I (HIGH SENSITIVITY)
Troponin I (High Sensitivity): <2 ng/L (ref <18)
Troponin I (High Sensitivity): <2 ng/L (ref <18)

## 2019-04-25 MED ORDER — SODIUM CHLORIDE 0.9% FLUSH: 3.0000 mL | Freq: Once | INTRAVENOUS | Status: DC

## 2019-04-25 NOTE — Discharge Instructions (Signed)
Please take Ibuprofen 600 mg every 6-8 hours as needed for your chest pain as it is likely musculoskeletal in nature.   Stay home and self isolate until you receive your COVID 19 test results (this can take 2-3 days to return). If negative you may return to your normal daily activity. If positive you will need to self isolate for 14 days (cleared: 05/10/2019).   Please follow up with your PCP.

## 2019-04-25 NOTE — ED Triage Notes (Signed)
Pt c/o left sided chest pain that goes into left shoulder along with tingling feeling in his left arm. Pt feels like his heart is beating really hard and he gets sweaty often.

## 2019-04-25 NOTE — ED Provider Notes (Signed)
[facility] EMERGENCY DEPARTMENT Provider Note   CSN: [id number] Arrival date & time: 04/25/19  1242     History   Chief Complaint Chief Complaint  Patient presents with  . Chest Pain    HPI John Brock is a 28 y.o. male who presents to the ED today complaining of sudden onset, constant, waxing and waning, left sided chest pain radiating into left upper arm x 3 days.  Patient reports that the pain began while he was having intercourse with his wife.  States that the sex was not any more vigorous than normal.  He reports that since then he has had waxing and waning pain to his left chest.  He states that the pain is exacerbated with deep inspiration.  He also notes dyspnea on exertion but cannot say if this is new or not for him.  Patient reports that last night he began having diaphoresis and nausea which really concerned him.  He endorses a significant family history for CAD including paternal uncle who had heart attack in his 25s, paternal grandfather who had heart attack in his 21s and passed away, other family members who have had heart attacks in their 45s and also passed away.  Patient has never been evaluated by a PCP or cardiologist in the past.  Dates he has not been taking anything for his pain.  No recent prolonged travel or immobilization.  No history of DVT/PE.  No hemoptysis.  No exogenous hormone use.  No active malignancy.  No family history of clotting disorders.  Denies fever, chills, vomiting, leg swelling, any other associated symptoms.    Patient does endorse that he was at a event with greater than 100 people about 5 days ago and no one was wearing masks.  He does mention that prior to his chest pain beginning he has been having diffuse pain to his lower extremities and lower back which is new for him.  States that he does not think he has not had any increase in cough as he states he has seasonal allergies and typically will cough when he wakes up.  He does report his  throat has felt more scratchy than normal.       Past Medical History:  Diagnosis Date  . Kidney stone     Patient Active Problem List   Diagnosis Date Noted  . Drug abuse and dependence ([place]) 10/09/2013  . Animal bite 09/11/2012    Past Surgical History:  Procedure Laterality Date  . ANTERIOR CRUCIATE LIGAMENT REPAIR Bilateral   . ROTATOR CUFF REPAIR Right         Home Medications    Prior to Admission medications   Medication Sig Start Date End Date Taking? Authorizing Provider  Multiple Vitamin (MULTIVITAMIN WITH MINERALS) TABS tablet Take 1 tablet by mouth daily.   Yes [provider]  ondansetron (ZOFRAN) 4 MG tablet Take 1 tablet (4 mg total) by mouth every 8 (eight) hours as needed for nausea or vomiting. Patient not taking: Reported on 04/25/2019 03/16/19   Rodell Perna A, PA-C  tamsulosin (FLOMAX) 0.4 MG CAPS capsule Take 1 capsule (0.4 mg total) by mouth daily after supper. Patient not taking: Reported on 04/25/2019 03/16/19   Renita Papa, PA-C    Family History No family history on file.  Social History Social History   Tobacco Use  . Smoking status: Current Some Day Smoker  . Smokeless tobacco: Current User    Types: Chew  Substance Use Topics  .  Alcohol use: Yes    Alcohol/week: 10.0 standard drinks    Types: 10 Shots of liquor per week    Comment: 3-4 times weekly   . Drug use: Not Currently    Types: Marijuana     Allergies   Patient has no known allergies.   Review of Systems Review of Systems  Constitutional: Positive for diaphoresis. Negative for chills and fever.  HENT: Positive for sore throat. Negative for congestion, drooling and trouble swallowing.   Eyes: Negative for visual disturbance.  Respiratory: Positive for shortness of breath. Negative for cough.   Cardiovascular: Positive for chest pain. Negative for leg swelling.  Gastrointestinal: Positive for nausea. Negative for abdominal pain, diarrhea and vomiting.   Genitourinary: Negative for difficulty urinating.  Musculoskeletal: Positive for myalgias.  Skin: Negative for rash.  Neurological: Negative for headaches.     Physical Exam Updated Vital Signs BP (!) 158/98 (BP Location: Right Arm)   Pulse 95   Temp 98.9 F (37.2 C) (Oral)   Resp 16   Ht (P) 5\' 7"  (1.702 m)   Wt 108.9 kg   SpO2 100%   BMI (P) 37.59 kg/m   Physical Exam Vitals signs and nursing note reviewed.  Constitutional:      Appearance: He is not ill-appearing or diaphoretic.  HENT:     Head: Normocephalic and atraumatic.  Eyes:     Conjunctiva/sclera: Conjunctivae normal.  Neck:     Musculoskeletal: Neck supple.  Cardiovascular:     Rate and Rhythm: Normal rate and regular rhythm.     Pulses:          Radial pulses are 2+ on the right side and 2+ on the left side.       Dorsalis pedis pulses are 2+ on the right side and 2+ on the left side.  Pulmonary:     Effort: Pulmonary effort is normal.     Breath sounds: Normal breath sounds. No decreased breath sounds, wheezing, rhonchi or rales.  Chest:     Chest wall: Tenderness present.    Abdominal:     Palpations: Abdomen is soft.     Tenderness: There is no abdominal tenderness. There is no guarding or rebound.  Musculoskeletal:     Right lower leg: He exhibits no tenderness. No edema.     Left lower leg: He exhibits no tenderness. No edema.  Skin:    General: Skin is warm and dry.  Neurological:     Mental Status: He is alert.      ED Treatments / Results  Labs (all labs ordered are listed, but only abnormal results are displayed) Labs Reviewed  NOVEL CORONAVIRUS, NAA (HOSP ORDER, SEND-OUT TO REF LAB; TAT 18-24 HRS)  BASIC METABOLIC PANEL  CBC  D-DIMER, QUANTITATIVE (NOT AT [facility])  POC SARS CORONAVIRUS 2 AG -  ED  TROPONIN I (HIGH SENSITIVITY)  TROPONIN I (HIGH SENSITIVITY)    EKG EKG Interpretation  Date/Time:  Wednesday April 25 2019 13:17:25 EST Ventricular Rate:  94 PR Interval:   136 QRS Duration: 84 QT Interval:  336 QTC Calculation: 420 R Axis:   80 Text Interpretation: Normal sinus rhythm Normal ECG No previous ECGs available Confirmed by 04-02-1995 [phone]) on 04/25/2019 11:03:34 PM   Radiology Dg Chest 2 View  Result Date: 04/25/2019 CLINICAL DATA:  LEFT chest pain, shortness of breath and cough for 3 days, smoker EXAM: CHEST - 2 VIEW COMPARISON:  08/27/2007 FINDINGS: Normal heart size, mediastinal contours, and pulmonary vascularity.  Lungs clear. No pleural effusion or pneumothorax. Bones unremarkable. IMPRESSION: Normal exam. Electronically Signed   By: Ulyses SouthwardMark  Boles M.D.   On: 04/25/2019 13:32    Procedures Procedures (including critical care time)  Medications Ordered in ED Medications  sodium chloride flush (NS) 0.9 % injection 3 mL (has no administration in time range)     Initial Impression / Assessment and Plan / ED Course  I have reviewed the triage vital signs and the nursing notes.  Pertinent labs & imaging results that were available during my care of the patient were reviewed by me and considered in my medical decision making (see chart for details).  28 year old male who presents to the ED today complaining of left-sided chest pain constantly for the past 3 days, waxing and waning after having intercourse with his wife.  Diaphoresis and nausea yesterday.  Has since resolved.  Patient does not appear to be in any acute distress on arrival.  He is afebrile without tachycardia or tachypnea.  Briefly mention that he has also been having body aches in his lower extremities and a scratchy throat.  Was recently in an area with greater than 100 people who were not wearing the masks.  EKG, chest x-ray, initial blood work obtained while patient was in the waiting room.  EKG without ischemic changes.  X-ray normal.  CBC without leukocytosis.  No electrolyte abnormalities.  Initial troponin less than 2.  Repeat troponin less than 2.   Patient reports  that chest pain is worsened with deep inspiration.  Given this will obtain D-dimer at this time although low suspicion.   Dimer negative.  Covid test negative.  Will send out repeat Covid test although suspect patient's chest pain is likely related to musculoskeletal.  Have advised that he take ibuprofen as needed for pain.  Have advised to follow-up with PCP as well.  Also help patient's body aches.  He is advised to self isolate until he received send out Covid test.  He does not require work note at this time.  If positive he needs to stay home for 14 days starting today.  All questions have been answered.  Return precautions have been discussed.  He is in agreement with plan at this time and stable for discharge home.  Clinical Course as of Apr 24 2304  Wed Apr 25, 2019  2148 D-Dimer, Quant: <0.27 [MV]    Clinical Course User Index [MV] Tanda RockersVenter, Yarithza Mink, PA-C   Linton HamDavid L Stallone was evaluated in Emergency Department on 04/25/2019 for the symptoms described in the history of present illness. He was evaluated in the context of the global COVID-19 pandemic, which necessitated consideration that the patient might be at risk for infection with the SARS-CoV-2 virus that causes COVID-19. Institutional protocols and algorithms that pertain to the evaluation of patients at risk for COVID-19 are in a state of rapid change based on information released by regulatory bodies including the CDC and federal and state organizations. These policies and algorithms were followed during the patient's care in the ED.  This note was prepared using Dragon voice recognition software and may include unintentional dictation errors due to the inherent limitations of voice recognition software.       Final Clinical Impressions(s) / ED Diagnoses   Final diagnoses:  Nonspecific chest pain  Viral illness    ED Discharge Orders    None       Tanda RockersVenter, Corrina Steffensen, PA-C 04/25/19 2305    Vanetta MuldersZackowski, Scott, MD 04/26/19  1517

## 2019-04-27 LAB — NOVEL CORONAVIRUS, NAA (HOSP ORDER, SEND-OUT TO REF LAB; TAT 18-24 HRS): SARS-CoV-2, NAA: NOT DETECTED

## 2019-07-10 DIAGNOSIS — Z9889 Other specified postprocedural states: Secondary | ICD-10-CM | POA: Insufficient documentation

## 2020-05-29 ENCOUNTER — Other Ambulatory Visit: Payer: Self-pay | Admitting: Orthopedic Surgery

## 2020-05-29 DIAGNOSIS — M25562 Pain in left knee: Secondary | ICD-10-CM

## 2020-05-29 DIAGNOSIS — R609 Edema, unspecified: Secondary | ICD-10-CM

## 2020-05-29 DIAGNOSIS — R531 Weakness: Secondary | ICD-10-CM

## 2020-06-17 ENCOUNTER — Ambulatory Visit
Admission: RE | Admit: 2020-06-17 | Discharge: 2020-06-17 | Disposition: A | Discharge status: Home / Self Care | Payer: BC Managed Care – PPO | Source: Ambulatory Visit | Attending: Orthopedic Surgery | Admitting: Orthopedic Surgery

## 2020-06-17 DIAGNOSIS — M25562 Pain in left knee: Secondary | ICD-10-CM

## 2020-06-17 DIAGNOSIS — R531 Weakness: Secondary | ICD-10-CM

## 2020-06-17 DIAGNOSIS — R609 Edema, unspecified: Secondary | ICD-10-CM

## 2020-09-30 IMAGING — DX DG CHEST 2V
2 series · 2 of 2 positions shown · non-contrast
Comparison: 08/27/2007

CLINICAL DATA: LEFT chest pain, shortness of breath and cough for 3
days, smoker

EXAM:
CHEST - 2 VIEW

[chest pa]
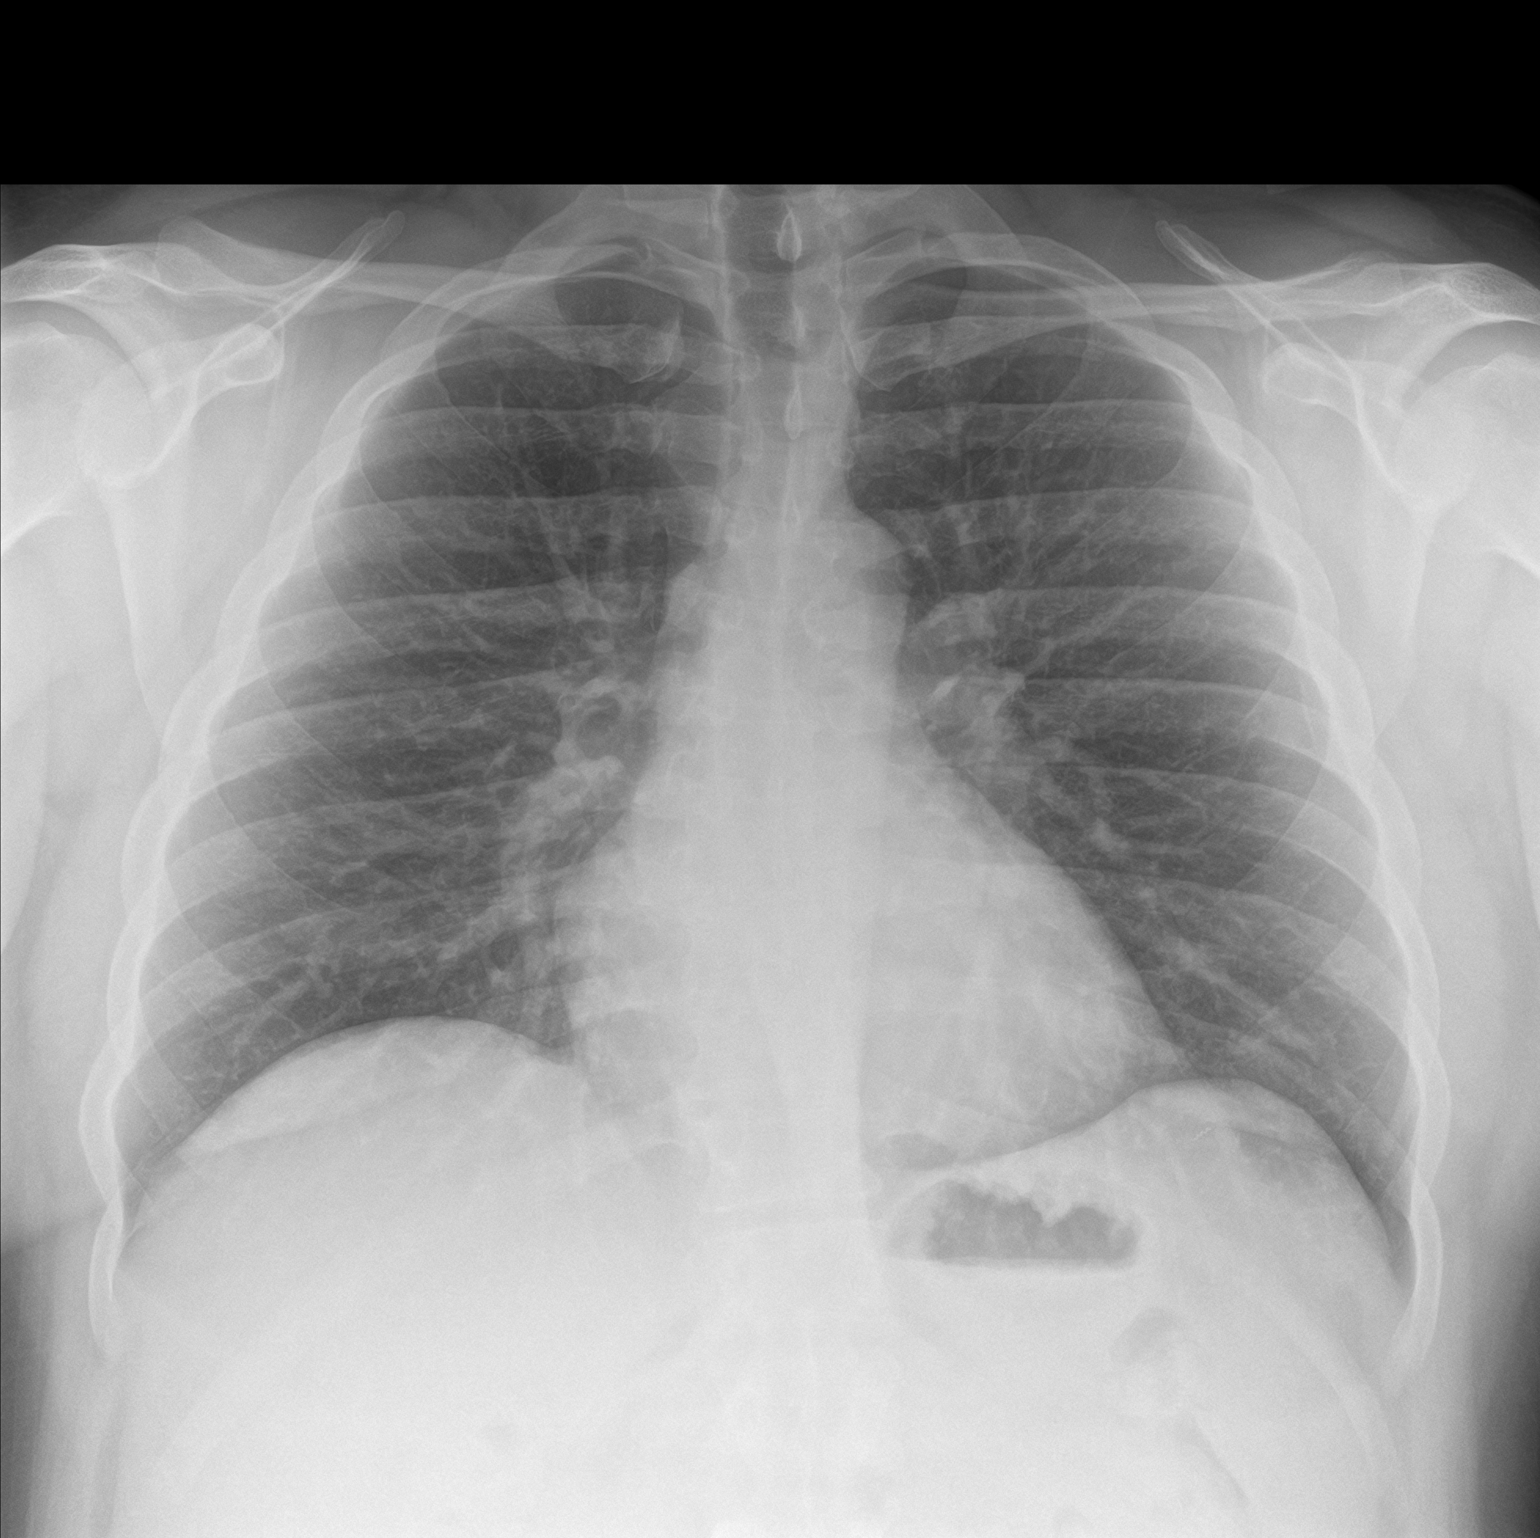

[chest lat]
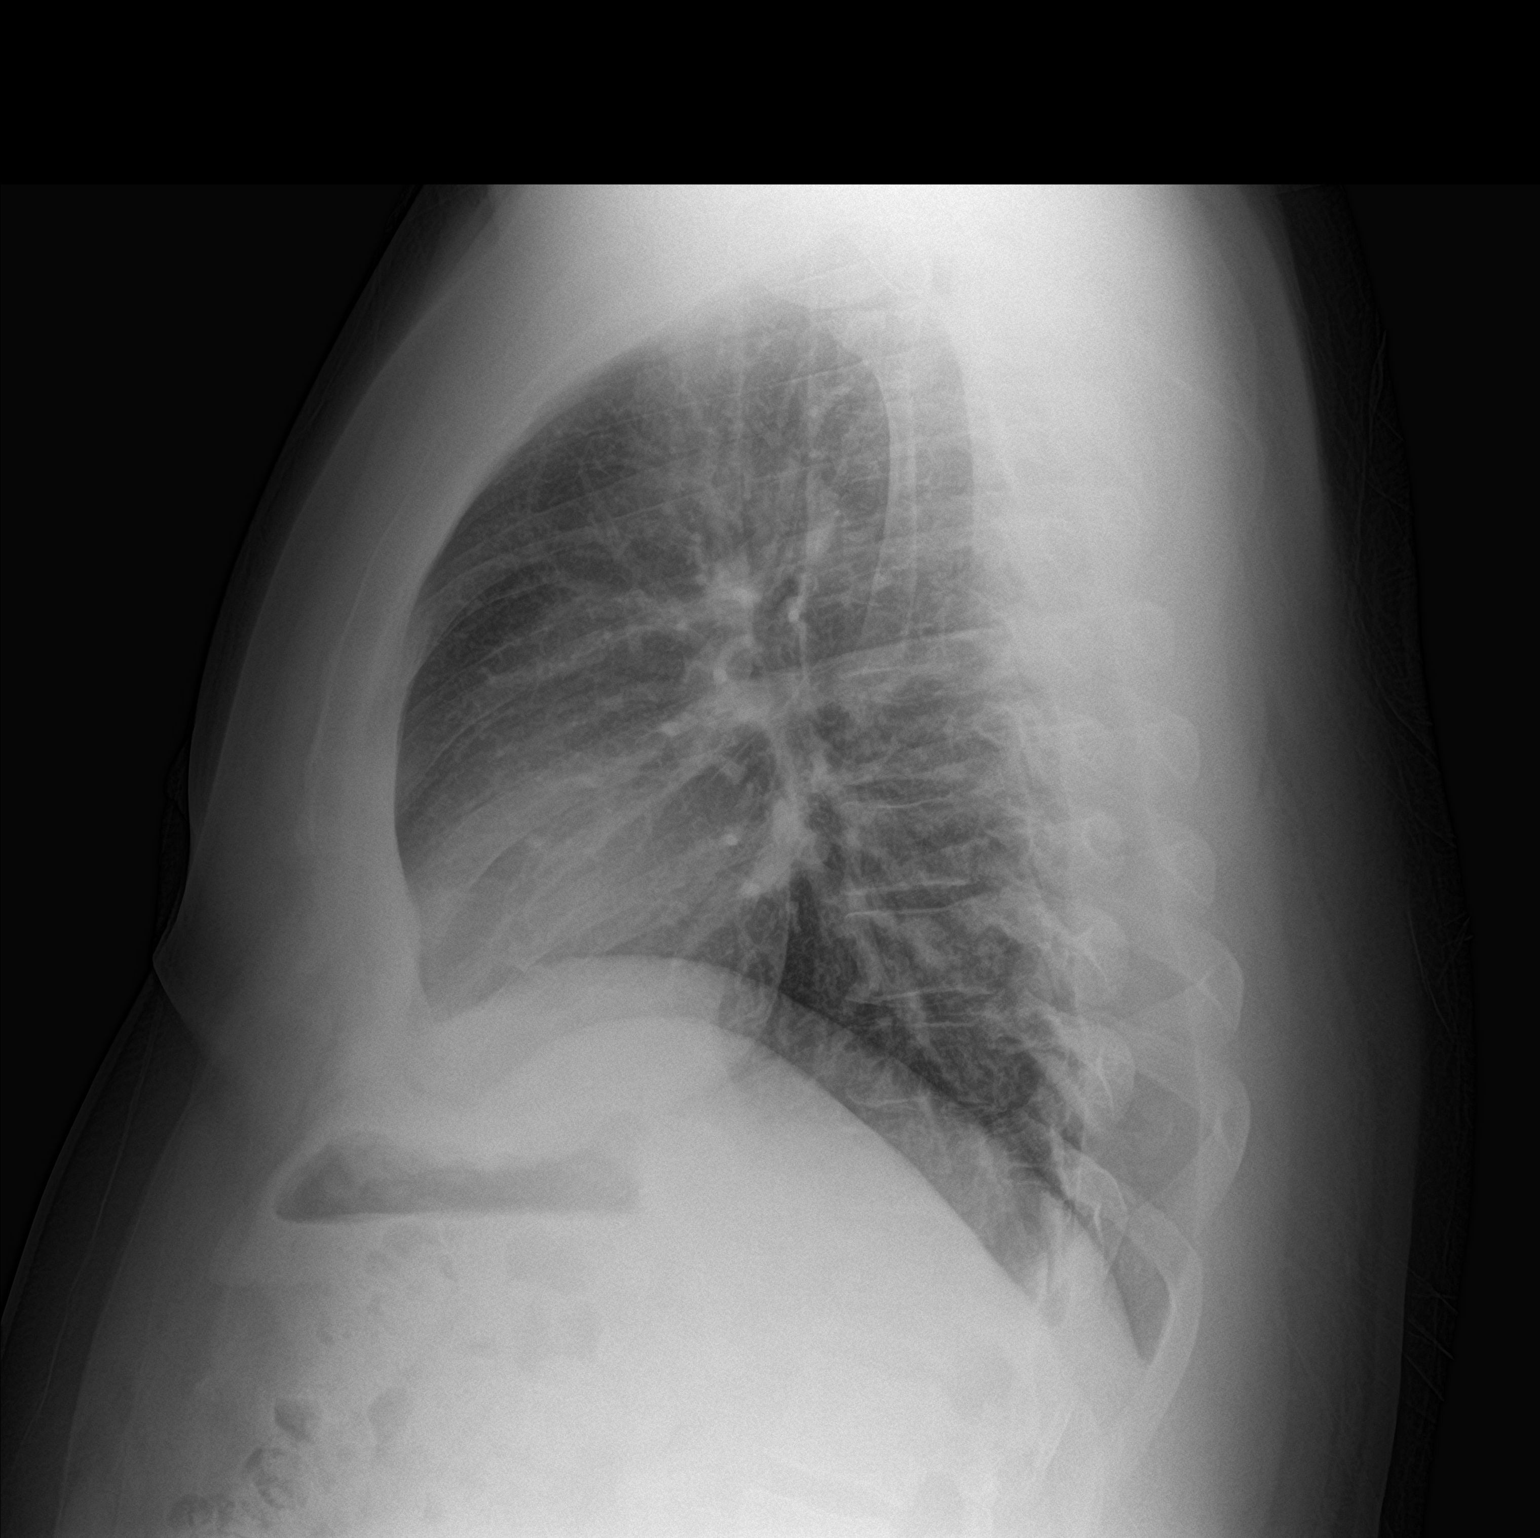

[2 of 2 positions shown; findings below may reference images not displayed]

FINDINGS: Normal heart size, mediastinal contours, and pulmonary vascularity.

Lungs clear.

No pleural effusion or pneumothorax.

Bones unremarkable.
IMPRESSION: Normal exam.

## 2021-03-31 ENCOUNTER — Other Ambulatory Visit: Payer: Self-pay

## 2021-03-31 ENCOUNTER — Ambulatory Visit
Admission: EM | Admit: 2021-03-31 | Discharge: 2021-03-31 | Disposition: A | Discharge status: Home / Self Care | Payer: BC Managed Care – PPO | Attending: Family Medicine | Admitting: Family Medicine

## 2021-03-31 DIAGNOSIS — S61012A Laceration without foreign body of left thumb without damage to nail, initial encounter: Secondary | ICD-10-CM

## 2021-03-31 MED ORDER — HIBICLENS 4 % EX LIQD: Freq: Every day | CUTANEOUS | 0 refills | Status: DC | PRN

## 2021-03-31 MED ORDER — MUPIROCIN 2 % EX OINT: 1.0000 "application " | TOPICAL_OINTMENT | Freq: Two times a day (BID) | CUTANEOUS | 0 refills | Status: DC

## 2021-03-31 MED ORDER — AMOXICILLIN-POT CLAVULANATE 875-125 MG PO TABS: 1.0000 | ORAL_TABLET | Freq: Two times a day (BID) | ORAL | 0 refills | Status: DC

## 2021-04-04 NOTE — ED Provider Notes (Signed)
[facility]    CSN: [id number] Arrival date & time: 03/31/21  1926      History   Chief Complaint No chief complaint on file.   HPI John Brock is a 30 y.o. male.   Presenting today with a significant laceration to the left thumb that occurred this evening while he was skinning a deer.  He states he lost track of the knife and it sliced through the dorsal aspect of his thumb.  States the bleeding has been significant and very difficult to control, though direct pressure does slow it down.  Able to move the thumb, some numbness, tingling to the area.  So far has run cold tap water over the area and kept it wrapped with a paper towel.  Believes he has had a tetanus shot within the last few years, almost certainly more recently than 5 years.   Past Medical History:  Diagnosis Date   Kidney stone     Patient Active Problem List   Diagnosis Date Noted   Drug abuse and dependence ([facility]) 10/09/2013   Animal bite 09/11/2012    Past Surgical History:  Procedure Laterality Date   ANTERIOR CRUCIATE LIGAMENT REPAIR Bilateral    ROTATOR CUFF REPAIR Right        Home Medications    Prior to Admission medications   Medication Sig Start Date End Date Taking? Authorizing Provider  amoxicillin-clavulanate (AUGMENTIN) 875-125 MG tablet Take 1 tablet by mouth every 12 (twelve) hours. 03/31/21  Yes Particia Nearing, PA-C  chlorhexidine (HIBICLENS) 4 % external liquid Apply topically daily as needed. 03/31/21  Yes Particia Nearing, PA-C  mupirocin ointment (BACTROBAN) 2 % Apply 1 application topically 2 (two) times daily. 03/31/21  Yes Particia Nearing, PA-C  Multiple Vitamin (MULTIVITAMIN WITH MINERALS) TABS tablet Take 1 tablet by mouth daily.    [provider]  ondansetron (ZOFRAN) 4 MG tablet Take 1 tablet (4 mg total) by mouth every 8 (eight) hours as needed for nausea or vomiting. Patient not taking: Reported on 04/25/2019 03/16/19   Michela Pitcher A, PA-C  tamsulosin (FLOMAX) 0.4 MG CAPS capsule Take 1 capsule (0.4 mg total) by mouth daily after supper. Patient not taking: Reported on 04/25/2019 03/16/19   Jeanie Sewer, PA-C    Family History No family history on file.  Social History Social History   Tobacco Use   Smoking status: Some Days   Smokeless tobacco: Current    Types: Chew  Substance Use Topics   Alcohol use: Yes    Alcohol/week: 10.0 standard drinks    Types: 10 Shots of liquor per week    Comment: 3-4 times weekly    Drug use: Not Currently    Types: Marijuana     Allergies   Patient has no known allergies.   Review of Systems Review of Systems Per HPI  Physical Exam Triage Vital Signs ED Triage Vitals [03/31/21 1946]  Enc Vitals Group     BP 133/75     Pulse Rate 80     Resp 16     Temp 98.3 F (36.8 C)     Temp Source Oral     SpO2 96 %     Weight      Height      Head Circumference      Peak Flow      Pain Score      Pain Loc      Pain Edu?  Excl. in GC?    No data found.  Updated Vital Signs BP 133/75 (BP Location: Right Arm)   Pulse 80   Temp 98.3 F (36.8 C) (Oral)   Resp 16   SpO2 96%   Visual Acuity Right Eye Distance:   Left Eye Distance:   Bilateral Distance:    Right Eye Near:   Left Eye Near:    Bilateral Near:     Physical Exam Vitals and nursing note reviewed.  Constitutional:      Appearance: Normal appearance.  HENT:     Head: Atraumatic.  Eyes:     Extraocular Movements: Extraocular movements intact.     Conjunctiva/sclera: Conjunctivae normal.  Cardiovascular:     Rate and Rhythm: Normal rate and regular rhythm.  Pulmonary:     Effort: Pulmonary effort is normal.     Breath sounds: Normal breath sounds.  Musculoskeletal:        General: Signs of injury present. Normal range of motion.     Cervical back: Normal range of motion and neck supple.     Comments: Range of motion and strength full and intact left thumb  Skin:    General:  Skin is warm.     Comments: Large partial-thickness flap dorsal aspect of left thumb from laceration.  Significant bleeding when pressure not applied.  Neurological:     General: No focal deficit present.     Mental Status: He is oriented to person, place, and time.     Comments: Left hand neurovascularly intact  Psychiatric:        Mood and Affect: Mood normal.        Thought Content: Thought content normal.        Judgment: Judgment normal.     UC Treatments / Results  Labs (all labs ordered are listed, but only abnormal results are displayed) Labs Reviewed - No data to display  EKG   Radiology No results found.  Procedures Laceration Repair  Date/Time: 03/31/2021 8:40 PM Performed by: Particia Nearing, PA-C Authorized by: Particia Nearing, PA-C   Consent:    Consent obtained:  Verbal   Consent given by:  Patient   Risks, benefits, and alternatives were discussed: yes     Risks discussed:  Pain, poor cosmetic result and poor wound healing   Alternatives discussed: Sutures. Universal protocol:    Procedure explained and questions answered to patient or proxy's satisfaction: yes     Relevant documents present and verified: yes     Patient identity confirmed:  Verbally with patient Anesthesia:    Anesthesia method:  Topical application   Topical anesthetic:  LET Laceration details:    Location:  Hand   Hand location:  L hand, dorsum   Length (cm):  1.5   Depth (mm):  5 Pre-procedure details:    Preparation:  Patient was prepped and draped in usual sterile fashion Exploration:    Hemostasis achieved with:  LET and direct pressure   Contaminated: no   Treatment:    Area cleansed with:  Chlorhexidine   Amount of cleaning:  Standard   Irrigation solution:  Sterile saline   Irrigation method:  Pressure wash   Visualized foreign bodies/material removed: no   Skin repair:    Repair method:  Tissue adhesive Approximation:    Approximation:   Close Repair type:    Repair type:  Simple Post-procedure details:    Dressing:  Bulky dressing and splint for protection   Procedure completion:  Tolerated well, no immediate complications (including critical care time)  Medications Ordered in UC Medications - No data to display  Initial Impression / Assessment and Plan / UC Course  I have reviewed the triage vital signs and the nursing notes.  Pertinent labs & imaging results that were available during my care of the patient were reviewed by me and considered in my medical decision making (see chart for details).     Initially prepared to suture the flap portion down, but patient at the last moment changed her mind and requested Dermabond closure.  Area was irrigated with chlorhexidine, let was used to slow the bleeding with good benefit.  Dermabond was then applied followed by a nonstick bulky dressing and a finger splint for protection.  We will treat with Augmentin, Hibiclens, mupirocin.  Declines updating tetanus shot today as he believes he has had one within the last 5 years.  Strict return precautions given for worsening symptoms.  Final Clinical Impressions(s) / UC Diagnoses   Final diagnoses:  Thumb laceration, left, initial encounter   Discharge Instructions   None    ED Prescriptions     Medication Sig Dispense Auth. Provider   chlorhexidine (HIBICLENS) 4 % external liquid Apply topically daily as needed. 120 mL Particia Nearing, PA-C   amoxicillin-clavulanate (AUGMENTIN) 875-125 MG tablet Take 1 tablet by mouth every 12 (twelve) hours. 14 tablet Particia Nearing, [place]   mupirocin ointment (BACTROBAN) 2 % Apply 1 application topically 2 (two) times daily. 22 g Particia Nearing, [place]      PDMP not reviewed this encounter.   Particia Nearing, [place] 04/04/21 1521

## 2021-08-19 ENCOUNTER — Ambulatory Visit
Admission: RE | Admit: 2021-08-19 | Discharge: 2021-08-19 | Disposition: A | Discharge status: Home / Self Care | Payer: BC Managed Care – PPO | Source: Ambulatory Visit

## 2021-08-19 VITALS — BP 128/91 | HR 74 | Temp 98.1°F | Resp 16

## 2021-08-19 DIAGNOSIS — H1031 Unspecified acute conjunctivitis, right eye: Secondary | ICD-10-CM

## 2021-08-19 DIAGNOSIS — J302 Other seasonal allergic rhinitis: Secondary | ICD-10-CM | POA: Diagnosis not present

## 2021-08-19 DIAGNOSIS — R0981 Nasal congestion: Secondary | ICD-10-CM | POA: Diagnosis not present

## 2021-08-19 MED ORDER — MOXIFLOXACIN HCL 0.5 % OP SOLN: 1.0000 [drp] | Freq: Three times a day (TID) | OPHTHALMIC | 0 refills | Status: AC

## 2021-08-19 NOTE — ED Triage Notes (Signed)
Patient presents to Urgent Care with complaints of nasal congestion, post nasal drip, and sore throat x 2 days. Eye swelling and redness overnight. Took one dose of Claritin.  ?

## 2021-08-19 NOTE — ED Provider Notes (Signed)
?[facility] ? ? ? ?CSN: [id number] ?Arrival date & time: 08/19/21  1433 ? ? ?  ? ?History   ?Chief Complaint ?Chief Complaint  ?Patient presents with  ? Nasal Congestion  ? Eye Problem  ? ? ?HPI ?John Brock is a 31 y.o. male presenting for approximately 2-day history of nasal congestion, postnasal drainage and scratchy sore throat.  Patient says overnight he developed some swelling of his right eye with redness and yellowish drainage.  Patient reports exposure to pinkeye through his son.  He states that he does get seasonal allergies.  He has taken 1 dose of Claritin.  Patient has also used Afrin.  He denies any associated fevers or cough.  No breathing difficulty, vomiting or diarrhea.  No known exposure to strep or COVID-19.  Has taken a COVID test at home and it was negative.  No other complaints. ? ?HPI ? ?Past Medical History:  ?Diagnosis Date  ? Kidney stone   ? ? ?Patient Active Problem List  ? Diagnosis Date Noted  ? S/P left knee arthroscopy 07/10/2019  ? Drug abuse and dependence (HCC) 10/09/2013  ? Animal bite 09/11/2012  ? ? ?Past Surgical History:  ?Procedure Laterality Date  ? ANTERIOR CRUCIATE LIGAMENT REPAIR Bilateral   ? ROTATOR CUFF REPAIR Right   ? ? ? ? ? ?Home Medications   ? ?Prior to Admission medications   ?Medication Sig Start Date End Date Taking? Authorizing Provider  ?moxifloxacin (VIGAMOX) 0.5 % ophthalmic solution Place 1 drop into the right eye 3 (three) times daily for 7 days. 08/19/21 08/26/21 Yes Shirlee Latch, PA-C  ?Buprenorphine HCl-Naloxone HCl 8-2 MG FILM SMARTSIG:0.5 Strip(s) Sublingual 3 Times Daily 08/11/21   [provider]  ?chlorhexidine (HIBICLENS) 4 % external liquid Apply topically daily as needed. 03/31/21   Particia Nearing, PA-C  ?Multiple Vitamin (MULTIVITAMIN WITH MINERALS) TABS tablet Take 1 tablet by mouth daily.    [provider]  ?mupirocin ointment (BACTROBAN) 2 % Apply 1 application topically 2 (two) times daily.  03/31/21   Particia Nearing, PA-C  ?ondansetron (ZOFRAN) 4 MG tablet Take 1 tablet (4 mg total) by mouth every 8 (eight) hours as needed for nausea or vomiting. ?Patient not taking: Reported on 04/25/2019 03/16/19   Michela Pitcher A, PA-C  ?tamsulosin (FLOMAX) 0.4 MG CAPS capsule Take 1 capsule (0.4 mg total) by mouth daily after supper. ?Patient not taking: Reported on 04/25/2019 03/16/19   Michela Pitcher A, PA-C  ? ? ?Family History ?History reviewed. No pertinent family history. ? ?Social History ?Social History  ? ?Tobacco Use  ? Smoking status: Some Days  ? Smokeless tobacco: Current  ?  Types: Chew  ?Substance Use Topics  ? Alcohol use: Yes  ?  Alcohol/week: 10.0 standard drinks  ?  Types: 10 Shots of liquor per week  ?  Comment: 3-4 times weekly   ? Drug use: Not Currently  ?  Types: Marijuana  ? ? ? ?Allergies   ?Patient has no known allergies. ? ? ?Review of Systems ?Review of Systems  ?Constitutional:  Negative for fatigue and fever.  ?HENT:  Positive for congestion, postnasal drip and rhinorrhea. Negative for sinus pressure, sinus pain and sore throat.   ?Eyes:  Positive for discharge and redness.  ?Respiratory:  Negative for cough and shortness of breath.   ?Gastrointestinal:  Negative for abdominal pain, diarrhea, nausea and vomiting.  ?Musculoskeletal:  Negative for myalgias.  ?Neurological:  Negative for weakness, light-headedness and headaches.  ?  Hematological:  Negative for adenopathy.  ? ? ?Physical Exam ?Triage Vital Signs ?ED Triage Vitals  ?Enc Vitals Group  ?   BP   ?   Pulse   ?   Resp   ?   Temp   ?   Temp src   ?   SpO2   ?   Weight   ?   Height   ?   Head Circumference   ?   Peak Flow   ?   Pain Score   ?   Pain Loc   ?   Pain Edu?   ?   Excl. in GC?   ? ?No data found. ? ?Updated Vital Signs ?BP (!) 128/91 (BP Location: Left Arm)   Pulse 74   Temp 98.1 ?F (36.7 ?C) (Oral)   Resp 16   SpO2 99%  ?     ? ?Physical Exam ?Vitals and nursing note reviewed.  ?Constitutional:   ?   General: He  is not in acute distress. ?   Appearance: Normal appearance. He is well-developed. He is not ill-appearing.  ?HENT:  ?   Head: Normocephalic and atraumatic.  ?   Nose: Congestion present.  ?   Mouth/Throat:  ?   Mouth: Mucous membranes are moist.  ?   Pharynx: Oropharynx is clear. Posterior oropharyngeal erythema (mild with clear PND) present.  ?Eyes:  ?   General: No scleral icterus.    ?   Right eye: Discharge (slight yellowish drainage) present.  ?   Conjunctiva/sclera:  ?   Right eye: Right conjunctiva is injected.  ?Cardiovascular:  ?   Rate and Rhythm: Normal rate and regular rhythm.  ?   Heart sounds: Normal heart sounds.  ?Pulmonary:  ?   Effort: Pulmonary effort is normal. No respiratory distress.  ?   Breath sounds: Normal breath sounds.  ?Musculoskeletal:  ?   Cervical back: Neck supple.  ?Skin: ?   General: Skin is warm and dry.  ?   Capillary Refill: Capillary refill takes less than 2 seconds.  ?Neurological:  ?   General: No focal deficit present.  ?   Mental Status: He is alert. Mental status is at baseline.  ?   Motor: No weakness.  ?   Coordination: Coordination normal.  ?   Gait: Gait normal.  ?Psychiatric:     ?   Mood and Affect: Mood normal.     ?   Behavior: Behavior normal.     ?   Thought Content: Thought content normal.  ? ? ? ?UC Treatments / Results  ?Labs ?(all labs ordered are listed, but only abnormal results are displayed) ?Labs Reviewed - No data to display ? ?EKG ? ? ?Radiology ?No results found. ? ?Procedures ?Procedures (including critical care time) ? ?Medications Ordered in UC ?Medications - No data to display ? ?Initial Impression / Assessment and Plan / UC Course  ?I have reviewed the triage vital signs and the nursing notes. ? ?Pertinent labs & imaging results that were available during my care of the patient were reviewed by me and considered in my medical decision making (see chart for details). ? ?31 year old male presenting for right eye redness and drainage after exposure  to pinkeye through his son.  Patient also reporting nasal congestion, postnasal drainage and scratchy sore throat over the past 2 days.  Negative home COVID test.  Clinical presentation consistent with allergies versus a viral illness given the discolored drainage from the eye  and exposure to pinkeye suspect bacterial pinkeye.  Sent Vigamox to pharmacy and encouraged him to use Flonase, Claritin-D, nasal saline and redness relief eyedrops.  Follow-up as needed. ? ? ?Final Clinical Impressions(s) / UC Diagnoses  ? ?Final diagnoses:  ?Acute bacterial conjunctivitis of right eye  ?Seasonal allergies  ?Nasal congestion  ? ? ? ?Discharge Instructions   ? ?  ?-Claritin-D, Flonase, nasal saline and redness relief drops.  I sent antibiotic eyedrops to pharmacy. ?- Increase rest and fluids. ?- Follow-up if not feeling better within next 2 days or for any worsening symptoms. ? ? ? ? ?ED Prescriptions   ? ? Medication Sig Dispense Auth. Provider  ? moxifloxacin (VIGAMOX) 0.5 % ophthalmic solution Place 1 drop into the right eye 3 (three) times daily for 7 days. 3 mL Shirlee LatchEaves, Nasreen Goedecke B, PA-C  ? ?  ? ?PDMP not reviewed this encounter. ?  ?Shirlee Latchaves, Alveta Quintela B, PA-C ?08/19/21 1515 ? ?

## 2021-08-19 NOTE — Discharge Instructions (Signed)
-  Claritin-D, Flonase, nasal saline and redness relief drops.  I sent antibiotic eyedrops to pharmacy. ?- Increase rest and fluids. ?- Follow-up if not feeling better within next 2 days or for any worsening symptoms. ?

## 2021-11-23 IMAGING — MR MR KNEE*L* W/O CM
4 of 6 series · 22 of 40 positions shown · non-contrast
Comparison: MRI left knee report dated May 28, 2011.

CLINICAL DATA: Left knee pain and swelling.

EXAM:
MRI OF THE LEFT KNEE WITHOUT CONTRAST
TECHNIQUE: Multiplanar, multisequence MR imaging of the knee was performed. No
intravenous contrast was administered.

[Series 4: T2 fat-sat · coronal · 4.0mm · 0.62mm/px · 6 of 29 slices shown (1 of 2)]
[im 1/29]
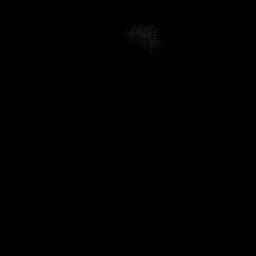
[im 6/29]
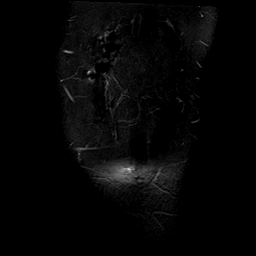
[im 12/29]
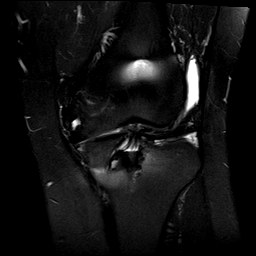
[im 17/29]
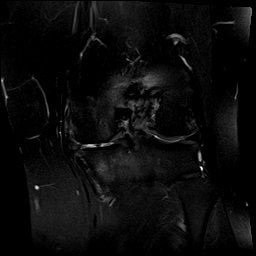
[im 23/29]
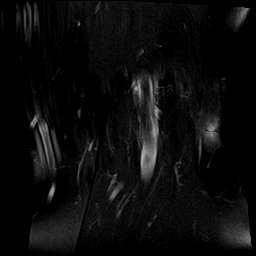
[im 29/29]
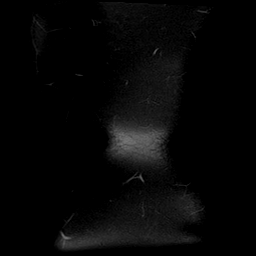

[Series 5: T1 · coronal · 4.0mm · 0.31mm/px · 3 of 29 slices shown]
[im 6/29]
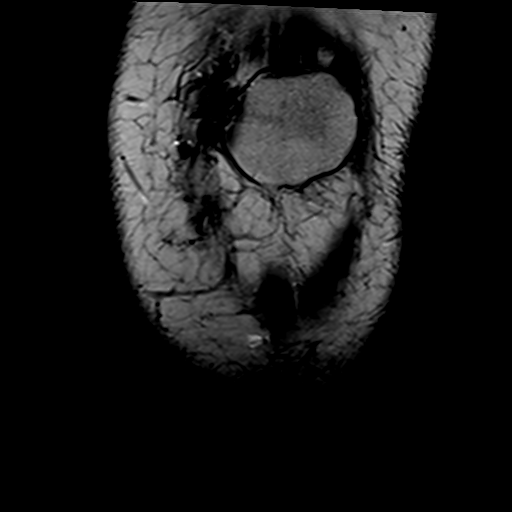
[im 17/29]
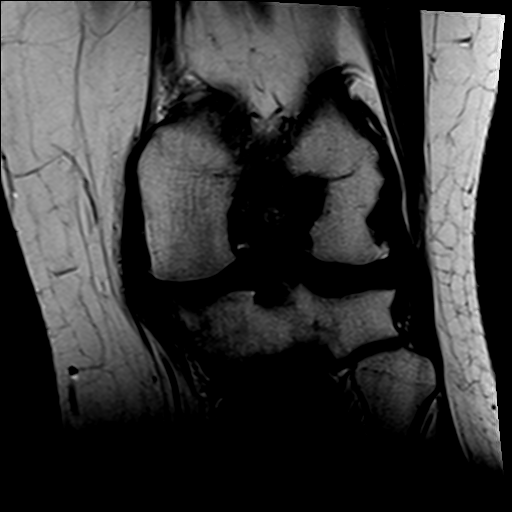
[im 29/29]
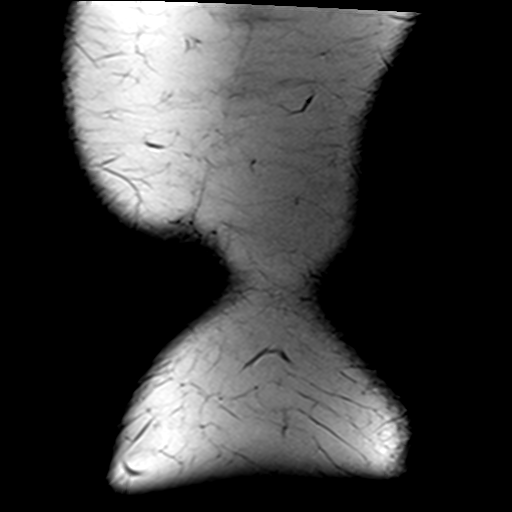

[Series 7: PD fat-sat · sagittal · 3.0mm · 0.31mm/px · 7 of 30 slices shown]
[im 1/30]
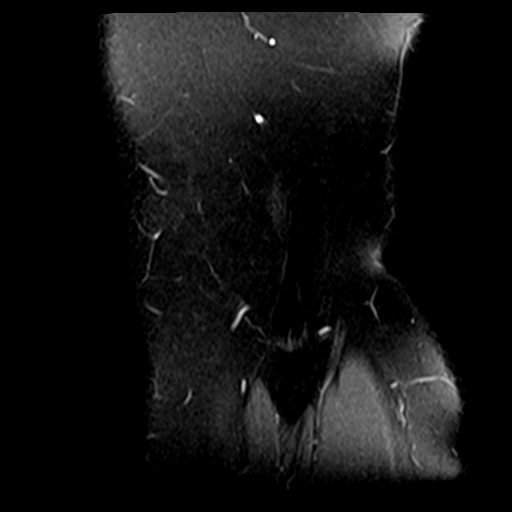
[im 5/30]
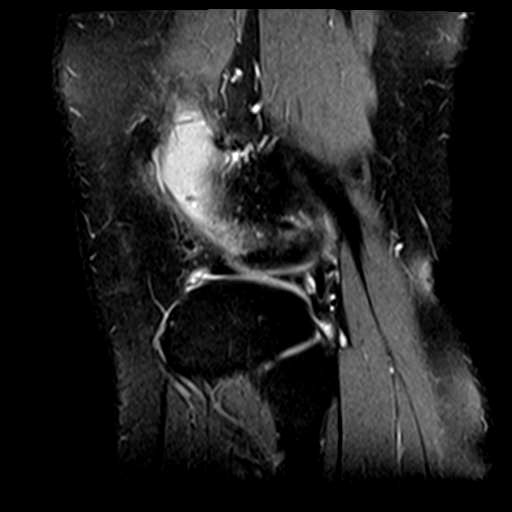
[im 10/30]
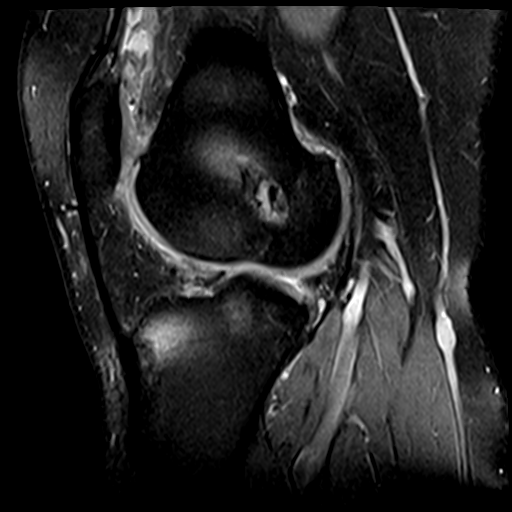
[im 15/30]
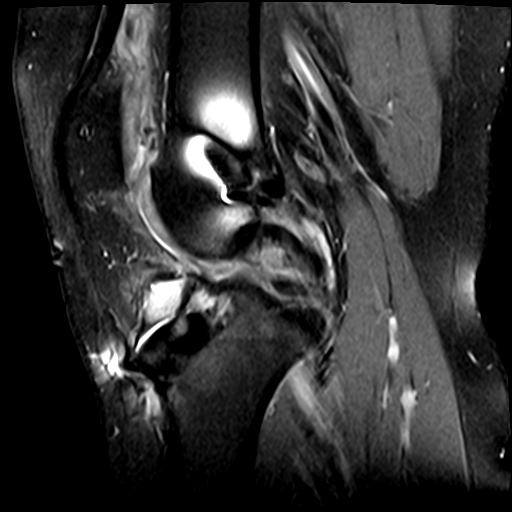
[im 20/30]
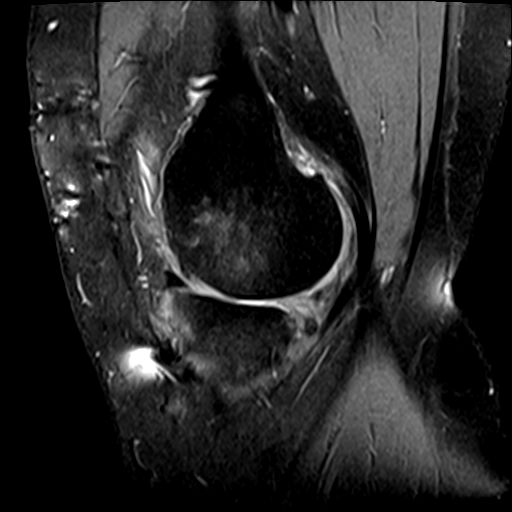
[im 25/30]
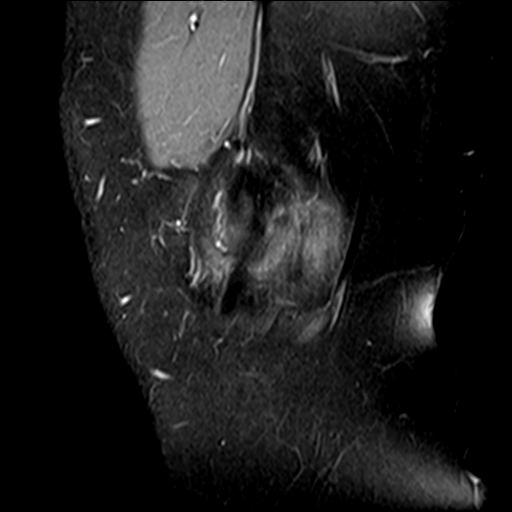
[im 30/30]
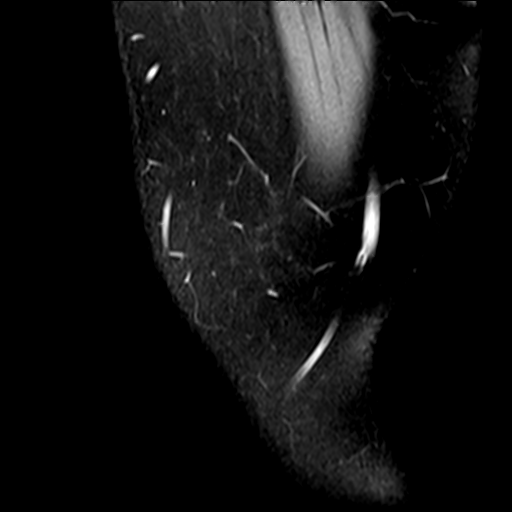

[Series 8: T2 fat-sat · sagittal · 3.0mm · 0.31mm/px · 6 of 30 slices shown (2 of 2)]
[im 1/30]
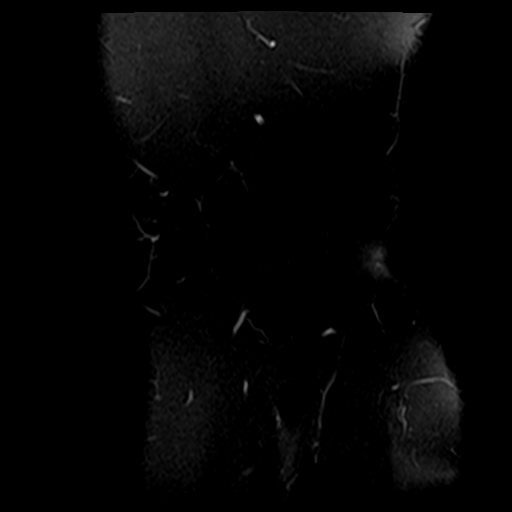
[im 5/30]
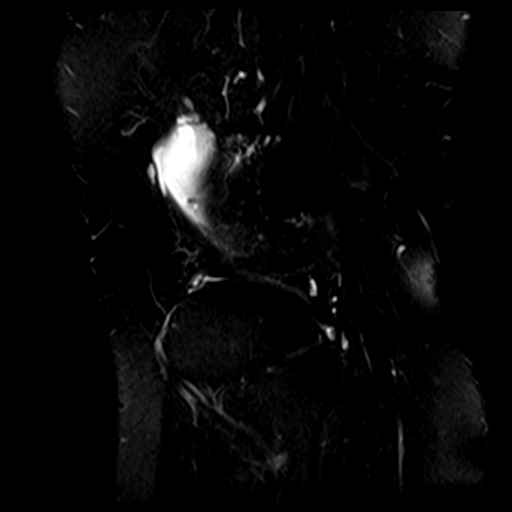
[im 10/30]
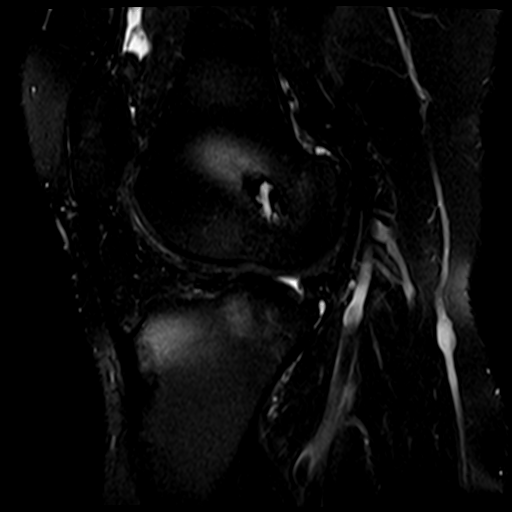
[im 15/30]
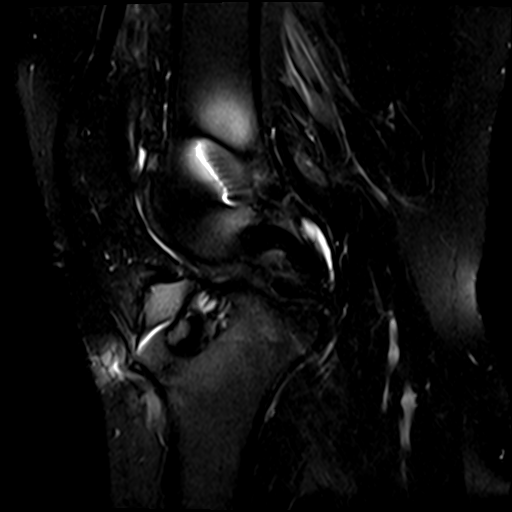
[im 20/30]
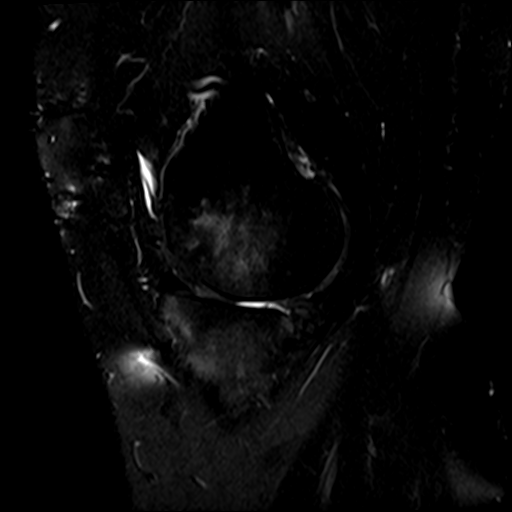
[im 25/30]
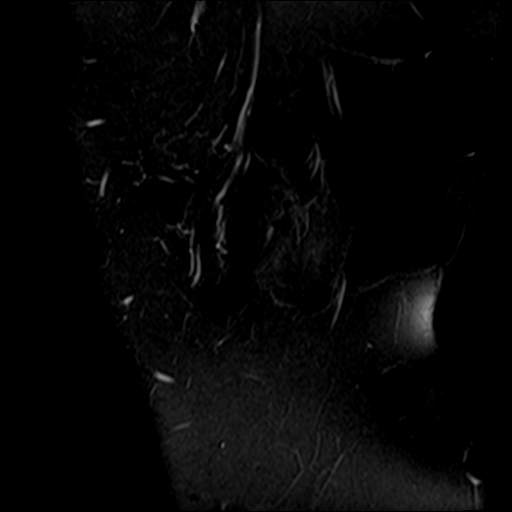

[22 of 40 positions shown; findings below may reference images not displayed]

FINDINGS: MENISCI

Medial meniscus: Complex tear of the posterior horn. Large radial
tear at the body/posterior horn junction. Degeneration and extrusion
of the body.

Lateral meniscus: Longitudinal undersurface tear of the posterior
horn.

LIGAMENTS

Cruciates: Prior ACL reconstruction. The graft appears degenerated
and angulated in its appearance as it enters the tibial tunnel,
consistent with impingement. The PCL is intact.

Collaterals: Medial collateral ligament is intact. Lateral
collateral ligament complex is intact.

CARTILAGE

Patellofemoral:  No chondral defect.

Medial: Full-thickness cartilage loss over the central
weight-bearing medial femoral condyle and medial tibial plateau.
Underlying focal 6 x 5 mm subchondral irregularity of the central
weight-bearing medial femoral condyle with associated marrow edema.

Lateral: Focal partial thickness cartilage loss over the central
weight-bearing lateral femoral condyle.

Joint: Small joint effusion. Mild scarring within Hoffa's fat.
Prominently thickened medial plica.

Popliteal Fossa:  No Baker cyst. Intact popliteus tendon.

Extensor Mechanism: Intact quadriceps tendon and patellar tendon.
Intact medial and lateral patellar retinaculum. Intact MPFL.

Bones: Early ganglion cyst development involving the femoral and
tibial tunnels. Mild reactive marrow edema surrounding the tibial
tunnel. No acute fracture or dislocation. No suspicious bone lesion.
Medial and lateral compartment marginal osteophytes.

Other: None.
IMPRESSION: 1. Complex tear of the medial meniscus posterior horn and large
radial tear at the body/posterior horn junction. Degeneration and
extrusion of the body.
2. Longitudinal undersurface tear of the lateral meniscus posterior
horn.
3. Prior ACL reconstruction with degenerated graft, evidence of
impingement, and early femoral and tibial tunnel ganglion cyst
formation.
4. Moderate medial and mild lateral compartment osteoarthritis.
Small osteochondral lesion of the central weight-bearing medial
femoral condyle.

## 2022-02-26 DIAGNOSIS — F1420 Cocaine dependence, uncomplicated: Secondary | ICD-10-CM | POA: Diagnosis not present

## 2022-02-26 DIAGNOSIS — R030 Elevated blood-pressure reading, without diagnosis of hypertension: Secondary | ICD-10-CM | POA: Diagnosis not present

## 2022-02-26 DIAGNOSIS — F1290 Cannabis use, unspecified, uncomplicated: Secondary | ICD-10-CM | POA: Diagnosis not present

## 2022-02-26 DIAGNOSIS — R69 Illness, unspecified: Secondary | ICD-10-CM | POA: Diagnosis not present

## 2022-02-26 DIAGNOSIS — F1120 Opioid dependence, uncomplicated: Secondary | ICD-10-CM | POA: Diagnosis not present

## 2022-03-01 DIAGNOSIS — R69 Illness, unspecified: Secondary | ICD-10-CM | POA: Diagnosis not present

## 2022-03-05 DIAGNOSIS — R030 Elevated blood-pressure reading, without diagnosis of hypertension: Secondary | ICD-10-CM | POA: Diagnosis not present

## 2022-03-05 DIAGNOSIS — F1290 Cannabis use, unspecified, uncomplicated: Secondary | ICD-10-CM | POA: Diagnosis not present

## 2022-03-05 DIAGNOSIS — R69 Illness, unspecified: Secondary | ICD-10-CM | POA: Diagnosis not present

## 2022-03-05 DIAGNOSIS — F1420 Cocaine dependence, uncomplicated: Secondary | ICD-10-CM | POA: Diagnosis not present

## 2022-03-12 DIAGNOSIS — R030 Elevated blood-pressure reading, without diagnosis of hypertension: Secondary | ICD-10-CM | POA: Diagnosis not present

## 2022-03-12 DIAGNOSIS — F1290 Cannabis use, unspecified, uncomplicated: Secondary | ICD-10-CM | POA: Diagnosis not present

## 2022-03-12 DIAGNOSIS — R69 Illness, unspecified: Secondary | ICD-10-CM | POA: Diagnosis not present

## 2022-03-12 DIAGNOSIS — F1420 Cocaine dependence, uncomplicated: Secondary | ICD-10-CM | POA: Diagnosis not present

## 2022-03-15 DIAGNOSIS — F1420 Cocaine dependence, uncomplicated: Secondary | ICD-10-CM | POA: Diagnosis not present

## 2022-03-15 DIAGNOSIS — F1290 Cannabis use, unspecified, uncomplicated: Secondary | ICD-10-CM | POA: Diagnosis not present

## 2022-03-15 DIAGNOSIS — R69 Illness, unspecified: Secondary | ICD-10-CM | POA: Diagnosis not present

## 2022-03-15 DIAGNOSIS — F1120 Opioid dependence, uncomplicated: Secondary | ICD-10-CM | POA: Diagnosis not present

## 2022-03-15 DIAGNOSIS — R030 Elevated blood-pressure reading, without diagnosis of hypertension: Secondary | ICD-10-CM | POA: Diagnosis not present

## 2022-03-22 DIAGNOSIS — R030 Elevated blood-pressure reading, without diagnosis of hypertension: Secondary | ICD-10-CM | POA: Diagnosis not present

## 2022-03-22 DIAGNOSIS — R69 Illness, unspecified: Secondary | ICD-10-CM | POA: Diagnosis not present

## 2022-03-22 DIAGNOSIS — F1290 Cannabis use, unspecified, uncomplicated: Secondary | ICD-10-CM | POA: Diagnosis not present

## 2022-03-22 DIAGNOSIS — F1420 Cocaine dependence, uncomplicated: Secondary | ICD-10-CM | POA: Diagnosis not present

## 2022-03-22 DIAGNOSIS — F1120 Opioid dependence, uncomplicated: Secondary | ICD-10-CM | POA: Diagnosis not present

## 2022-03-29 DIAGNOSIS — R69 Illness, unspecified: Secondary | ICD-10-CM | POA: Diagnosis not present

## 2022-03-29 DIAGNOSIS — F1290 Cannabis use, unspecified, uncomplicated: Secondary | ICD-10-CM | POA: Diagnosis not present

## 2022-03-29 DIAGNOSIS — R030 Elevated blood-pressure reading, without diagnosis of hypertension: Secondary | ICD-10-CM | POA: Diagnosis not present

## 2022-03-29 DIAGNOSIS — F1120 Opioid dependence, uncomplicated: Secondary | ICD-10-CM | POA: Diagnosis not present

## 2022-03-29 DIAGNOSIS — Z7289 Other problems related to lifestyle: Secondary | ICD-10-CM | POA: Diagnosis not present

## 2022-03-29 DIAGNOSIS — F1420 Cocaine dependence, uncomplicated: Secondary | ICD-10-CM | POA: Diagnosis not present

## 2022-03-31 DIAGNOSIS — Z7289 Other problems related to lifestyle: Secondary | ICD-10-CM | POA: Diagnosis not present

## 2022-03-31 DIAGNOSIS — F1420 Cocaine dependence, uncomplicated: Secondary | ICD-10-CM | POA: Diagnosis not present

## 2022-03-31 DIAGNOSIS — R69 Illness, unspecified: Secondary | ICD-10-CM | POA: Diagnosis not present

## 2022-03-31 DIAGNOSIS — F1290 Cannabis use, unspecified, uncomplicated: Secondary | ICD-10-CM | POA: Diagnosis not present

## 2022-04-05 DIAGNOSIS — F1120 Opioid dependence, uncomplicated: Secondary | ICD-10-CM | POA: Diagnosis not present

## 2022-04-05 DIAGNOSIS — F1420 Cocaine dependence, uncomplicated: Secondary | ICD-10-CM | POA: Diagnosis not present

## 2022-04-05 DIAGNOSIS — R030 Elevated blood-pressure reading, without diagnosis of hypertension: Secondary | ICD-10-CM | POA: Diagnosis not present

## 2022-04-05 DIAGNOSIS — F1290 Cannabis use, unspecified, uncomplicated: Secondary | ICD-10-CM | POA: Diagnosis not present

## 2022-04-05 DIAGNOSIS — R69 Illness, unspecified: Secondary | ICD-10-CM | POA: Diagnosis not present

## 2022-04-05 DIAGNOSIS — B360 Pityriasis versicolor: Secondary | ICD-10-CM | POA: Diagnosis not present

## 2022-04-12 DIAGNOSIS — R69 Illness, unspecified: Secondary | ICD-10-CM | POA: Diagnosis not present

## 2022-04-12 DIAGNOSIS — F1420 Cocaine dependence, uncomplicated: Secondary | ICD-10-CM | POA: Diagnosis not present

## 2022-04-12 DIAGNOSIS — B360 Pityriasis versicolor: Secondary | ICD-10-CM | POA: Diagnosis not present

## 2022-04-12 DIAGNOSIS — R030 Elevated blood-pressure reading, without diagnosis of hypertension: Secondary | ICD-10-CM | POA: Diagnosis not present

## 2022-04-12 DIAGNOSIS — F1120 Opioid dependence, uncomplicated: Secondary | ICD-10-CM | POA: Diagnosis not present

## 2022-04-12 DIAGNOSIS — F1290 Cannabis use, unspecified, uncomplicated: Secondary | ICD-10-CM | POA: Diagnosis not present

## 2022-04-19 DIAGNOSIS — B360 Pityriasis versicolor: Secondary | ICD-10-CM | POA: Diagnosis not present

## 2022-04-19 DIAGNOSIS — R69 Illness, unspecified: Secondary | ICD-10-CM | POA: Diagnosis not present

## 2022-04-19 DIAGNOSIS — R030 Elevated blood-pressure reading, without diagnosis of hypertension: Secondary | ICD-10-CM | POA: Diagnosis not present

## 2022-04-19 DIAGNOSIS — F1290 Cannabis use, unspecified, uncomplicated: Secondary | ICD-10-CM | POA: Diagnosis not present

## 2022-04-19 DIAGNOSIS — F1420 Cocaine dependence, uncomplicated: Secondary | ICD-10-CM | POA: Diagnosis not present

## 2022-04-26 DIAGNOSIS — R69 Illness, unspecified: Secondary | ICD-10-CM | POA: Diagnosis not present

## 2022-04-26 DIAGNOSIS — R030 Elevated blood-pressure reading, without diagnosis of hypertension: Secondary | ICD-10-CM | POA: Diagnosis not present

## 2022-04-26 DIAGNOSIS — F1420 Cocaine dependence, uncomplicated: Secondary | ICD-10-CM | POA: Diagnosis not present

## 2022-04-26 DIAGNOSIS — B360 Pityriasis versicolor: Secondary | ICD-10-CM | POA: Diagnosis not present

## 2022-04-26 DIAGNOSIS — F1290 Cannabis use, unspecified, uncomplicated: Secondary | ICD-10-CM | POA: Diagnosis not present

## 2022-05-02 ENCOUNTER — Ambulatory Visit
Admission: EM | Admit: 2022-05-02 | Discharge: 2022-05-02 | Disposition: A | Discharge status: Home / Self Care | Payer: 59 | Attending: Family Medicine | Admitting: Family Medicine

## 2022-05-02 DIAGNOSIS — J0100 Acute maxillary sinusitis, unspecified: Secondary | ICD-10-CM | POA: Diagnosis not present

## 2022-05-02 MED ORDER — AMOXICILLIN-POT CLAVULANATE 875-125 MG PO TABS: 1.0000 | ORAL_TABLET | Freq: Two times a day (BID) | ORAL | 0 refills | Status: DC

## 2022-05-02 MED ORDER — FLUTICASONE PROPIONATE 50 MCG/ACT NA SUSP: 1.0000 | Freq: Two times a day (BID) | NASAL | 2 refills | Status: DC

## 2022-05-02 NOTE — Discharge Instructions (Signed)
Take the Flonase nasal spray twice daily and use a decongestant such as what is in DayQuil or NyQuil or plain Sudafed to help with the pressure in your ears.  I have also sent over an antibiotic to treat your sinus infection.  Use warm saline sinus rinses to help with this.

## 2022-05-02 NOTE — ED Provider Notes (Signed)
[facility]    CSN: [id number] Arrival date & time: 05/02/22  1309      History   Chief Complaint No chief complaint on file.   HPI John Brock is a 31 y.o. male.   Patient presenting today with 2-week history of fatigue, nasal congestion, headache, hoarseness, now with facial pain and pressure.  Denies fever, chills, chest pain, shortness of breath, abdominal pain, nausea vomiting or diarrhea.  Now having pressure and popping to his left ear.  So far not trying anything over-the-counter for symptoms.    Past Medical History:  Diagnosis Date   Kidney stone     Patient Active Problem List   Diagnosis Date Noted   S/P left knee arthroscopy 07/10/2019   Drug abuse and dependence (HCC) 10/09/2013   Animal bite 09/11/2012    Past Surgical History:  Procedure Laterality Date   ANTERIOR CRUCIATE LIGAMENT REPAIR Bilateral    ROTATOR CUFF REPAIR Right        Home Medications    Prior to Admission medications   Medication Sig Start Date End Date Taking? Authorizing Provider  amoxicillin-clavulanate (AUGMENTIN) 875-125 MG tablet Take 1 tablet by mouth every 12 (twelve) hours. 05/02/22  Yes Particia Nearing, PA-C  fluticasone [facility]) 50 MCG/ACT nasal spray Place 1 spray into both nostrils 2 (two) times daily. 05/02/22  Yes Particia Nearing, PA-C  Buprenorphine HCl-Naloxone HCl 8-2 MG FILM SMARTSIG:0.5 Strip(s) Sublingual 3 Times Daily 08/11/21   [provider]  chlorhexidine (HIBICLENS) 4 % external liquid Apply topically daily as needed. 03/31/21   Particia Nearing, PA-C  Multiple Vitamin (MULTIVITAMIN WITH MINERALS) TABS tablet Take 1 tablet by mouth daily.    [provider]  mupirocin ointment (BACTROBAN) 2 % Apply 1 application topically 2 (two) times daily. 03/31/21   Particia Nearing, PA-C  ondansetron (ZOFRAN) 4 MG tablet Take 1 tablet (4 mg total) by mouth every 8 (eight) hours as needed for nausea or  vomiting. Patient not taking: Reported on 04/25/2019 03/16/19   Michela Pitcher A, PA-C  tamsulosin (FLOMAX) 0.4 MG CAPS capsule Take 1 capsule (0.4 mg total) by mouth daily after supper. Patient not taking: Reported on 04/25/2019 03/16/19   Jeanie Sewer, PA-C    Family History History reviewed. No pertinent family history.  Social History Social History   Tobacco Use   Smoking status: Some Days   Smokeless tobacco: Current    Types: Chew  Substance Use Topics   Alcohol use: Yes    Alcohol/week: 10.0 standard drinks of alcohol    Types: 10 Shots of liquor per week    Comment: 3-4 times weekly    Drug use: Not Currently    Types: Marijuana     Allergies   Patient has no known allergies.   Review of Systems Review of Systems HPI  Physical Exam Triage Vital Signs ED Triage Vitals  Enc Vitals Group     BP 05/02/22 1427 135/79     Pulse Rate 05/02/22 1427 72     Resp 05/02/22 1427 20     Temp 05/02/22 1427 98.4 F (36.9 C)     Temp Source 05/02/22 1427 Oral     SpO2 05/02/22 1427 94 %     Weight --      Height --      Head Circumference --      Peak Flow --      Pain Score 05/02/22 1503 0  Pain Loc --      Pain Edu? --      Excl. in GC? --    No data found.  Updated Vital Signs BP 135/79 (BP Location: Right Arm)   Pulse 72   Temp 98.4 F (36.9 C) (Oral)   Resp 20   SpO2 94%   Visual Acuity Right Eye Distance:   Left Eye Distance:   Bilateral Distance:    Right Eye Near:   Left Eye Near:    Bilateral Near:     Physical Exam Vitals and nursing note reviewed.  Constitutional:      Appearance: He is well-developed.  HENT:     Head: Atraumatic.     Right Ear: External ear normal.     Left Ear: External ear normal.     Ears:     Comments: Bilateral middle ear effusions    Nose: Congestion present.     Mouth/Throat:     Pharynx: Posterior oropharyngeal erythema present. No oropharyngeal exudate.  Eyes:     Conjunctiva/sclera: Conjunctivae  normal.     Pupils: Pupils are equal, round, and reactive to light.  Cardiovascular:     Rate and Rhythm: Normal rate and regular rhythm.  Pulmonary:     Effort: Pulmonary effort is normal. No respiratory distress.     Breath sounds: No wheezing or rales.  Musculoskeletal:        General: Normal range of motion.     Cervical back: Normal range of motion and neck supple.  Lymphadenopathy:     Cervical: No cervical adenopathy.  Skin:    General: Skin is warm and dry.  Neurological:     Mental Status: He is alert and oriented to person, place, and time.  Psychiatric:        Behavior: Behavior normal.      UC Treatments / Results  Labs (all labs ordered are listed, but only abnormal results are displayed) Labs Reviewed - No data to display  EKG   Radiology No results found.  Procedures Procedures (including critical care time)  Medications Ordered in UC Medications - No data to display  Initial Impression / Assessment and Plan / UC Course  I have reviewed the triage vital signs and the nursing notes.  Pertinent labs & imaging results that were available during my care of the patient were reviewed by me and considered in my medical decision making (see chart for details).     Given duration worsening course, treat with Augmentin, Flonase, decongestants.  Return for worsening symptoms.  Final Clinical Impressions(s) / UC Diagnoses   Final diagnoses:  Acute maxillary sinusitis, recurrence not specified     Discharge Instructions      Take the Flonase nasal spray twice daily and use a decongestant such as what is in DayQuil or NyQuil or plain Sudafed to help with the pressure in your ears.  I have also sent over an antibiotic to treat your sinus infection.  Use warm saline sinus rinses to help with this.    ED Prescriptions     Medication Sig Dispense Auth. Provider   amoxicillin-clavulanate (AUGMENTIN) 875-125 MG tablet Take 1 tablet by mouth every 12  (twelve) hours. 14 tablet Particia Nearing, PA-C   fluticasone [facility]) 50 MCG/ACT nasal spray Place 1 spray into both nostrils 2 (two) times daily. 16 g Particia Nearing, [place]      PDMP not reviewed this encounter.   Particia Nearing, [place] 05/02/22 1539

## 2022-05-02 NOTE — ED Triage Notes (Signed)
Pt reports sore throat, head congestion, blood in nose, and hoarse to no voice, both ears feel clogged and hurt overall feel fatigue x 2 weeks. Took ibuprofen but no relief.

## 2022-05-03 DIAGNOSIS — F1290 Cannabis use, unspecified, uncomplicated: Secondary | ICD-10-CM | POA: Diagnosis not present

## 2022-05-03 DIAGNOSIS — R69 Illness, unspecified: Secondary | ICD-10-CM | POA: Diagnosis not present

## 2022-05-03 DIAGNOSIS — B360 Pityriasis versicolor: Secondary | ICD-10-CM | POA: Diagnosis not present

## 2022-05-03 DIAGNOSIS — R030 Elevated blood-pressure reading, without diagnosis of hypertension: Secondary | ICD-10-CM | POA: Diagnosis not present

## 2022-05-03 DIAGNOSIS — F1420 Cocaine dependence, uncomplicated: Secondary | ICD-10-CM | POA: Diagnosis not present

## 2022-05-11 DIAGNOSIS — R030 Elevated blood-pressure reading, without diagnosis of hypertension: Secondary | ICD-10-CM | POA: Diagnosis not present

## 2022-05-11 DIAGNOSIS — B360 Pityriasis versicolor: Secondary | ICD-10-CM | POA: Diagnosis not present

## 2022-05-11 DIAGNOSIS — R69 Illness, unspecified: Secondary | ICD-10-CM | POA: Diagnosis not present

## 2022-05-11 DIAGNOSIS — F1290 Cannabis use, unspecified, uncomplicated: Secondary | ICD-10-CM | POA: Diagnosis not present

## 2022-05-11 DIAGNOSIS — F1420 Cocaine dependence, uncomplicated: Secondary | ICD-10-CM | POA: Diagnosis not present

## 2022-05-18 DIAGNOSIS — B360 Pityriasis versicolor: Secondary | ICD-10-CM | POA: Diagnosis not present

## 2022-05-18 DIAGNOSIS — F1420 Cocaine dependence, uncomplicated: Secondary | ICD-10-CM | POA: Diagnosis not present

## 2022-05-18 DIAGNOSIS — R030 Elevated blood-pressure reading, without diagnosis of hypertension: Secondary | ICD-10-CM | POA: Diagnosis not present

## 2022-05-18 DIAGNOSIS — F1120 Opioid dependence, uncomplicated: Secondary | ICD-10-CM | POA: Diagnosis not present

## 2022-05-18 DIAGNOSIS — F1290 Cannabis use, unspecified, uncomplicated: Secondary | ICD-10-CM | POA: Diagnosis not present

## 2022-05-18 DIAGNOSIS — R69 Illness, unspecified: Secondary | ICD-10-CM | POA: Diagnosis not present

## 2022-05-24 DIAGNOSIS — F1120 Opioid dependence, uncomplicated: Secondary | ICD-10-CM | POA: Diagnosis not present

## 2022-05-24 DIAGNOSIS — F1290 Cannabis use, unspecified, uncomplicated: Secondary | ICD-10-CM | POA: Diagnosis not present

## 2022-05-24 DIAGNOSIS — B360 Pityriasis versicolor: Secondary | ICD-10-CM | POA: Diagnosis not present

## 2022-05-24 DIAGNOSIS — R69 Illness, unspecified: Secondary | ICD-10-CM | POA: Diagnosis not present

## 2022-05-24 DIAGNOSIS — F1420 Cocaine dependence, uncomplicated: Secondary | ICD-10-CM | POA: Diagnosis not present

## 2022-05-24 DIAGNOSIS — R030 Elevated blood-pressure reading, without diagnosis of hypertension: Secondary | ICD-10-CM | POA: Diagnosis not present

## 2022-05-27 ENCOUNTER — Encounter: Payer: Self-pay | Admitting: Emergency Medicine

## 2022-05-27 ENCOUNTER — Ambulatory Visit
Admission: EM | Admit: 2022-05-27 | Discharge: 2022-05-27 | Disposition: A | Discharge status: Home / Self Care | Payer: 59 | Attending: Physician Assistant | Admitting: Physician Assistant

## 2022-05-27 DIAGNOSIS — B360 Pityriasis versicolor: Secondary | ICD-10-CM | POA: Diagnosis not present

## 2022-05-27 DIAGNOSIS — K1370 Unspecified lesions of oral mucosa: Secondary | ICD-10-CM

## 2022-05-27 HISTORY — DX: Pityriasis versicolor: B36.0

## 2022-05-27 MED ORDER — FLUCONAZOLE 100 MG PO TABS: 100.0000 mg | ORAL_TABLET | Freq: Every day | ORAL | 1 refills | Status: DC

## 2022-05-27 NOTE — ED Triage Notes (Signed)
Left jaw pain states he has a white patch on the inside of this mouth on that side.  States he has been dipping for about 20 years.    States he has a discolored rash on his body that he has had for 5 years.  States he has taken a pill that made the rash go away and would like to take this medication again  Tinea versicolor was what he was diagnosed with.

## 2022-05-27 NOTE — Discharge Instructions (Signed)
Call [phone] to schedule an appointment with oral surgery for evaluation.   It would also be helpful to see your local dentist.

## 2022-05-27 NOTE — ED Notes (Signed)
Patient states diflucan was the medication that helped his rash

## 2022-05-27 NOTE — ED Provider Notes (Signed)
[facility]    CSN: [id number] Arrival date & time: 05/27/22  1505      History   Chief Complaint No chief complaint on file.   HPI John Brock is a 32 y.o. male.   Patient complains of a skin rash.  Patient reports he has had tinea versicolor multiple times in the past and was requesting a prescription for Diflucan as this has worked well for him in the past.  Patient also reports that he has a white lesion on his left lower gumline patient reports the area has been there for approximately 2 months he states that he has used oral tobacco for 20 years.  Patient reports he has some swelling to the left side of his neck as well.     Past Medical History:  Diagnosis Date   Kidney stone    Tinea versicolor     Patient Active Problem List   Diagnosis Date Noted   S/P left knee arthroscopy 07/10/2019   Drug abuse and dependence (HCC) 10/09/2013   Animal bite 09/11/2012    Past Surgical History:  Procedure Laterality Date   ANTERIOR CRUCIATE LIGAMENT REPAIR Bilateral    ROTATOR CUFF REPAIR Right        Home Medications    Prior to Admission medications   Medication Sig Start Date End Date Taking? Authorizing Provider  fluconazole (DIFLUCAN) 100 MG tablet Take 1 tablet (100 mg total) by mouth daily. 05/27/22  Yes Cheron Schaumann K, PA-C  amoxicillin-clavulanate (AUGMENTIN) 875-125 MG tablet Take 1 tablet by mouth every 12 (twelve) hours. 05/02/22   Particia Nearing, PA-C  Buprenorphine HCl-Naloxone HCl 8-2 MG FILM SMARTSIG:0.5 Strip(s) Sublingual 3 Times Daily 08/11/21   [provider]  chlorhexidine (HIBICLENS) 4 % external liquid Apply topically daily as needed. 03/31/21   Particia Nearing, PA-C  fluticasone [facility]) 50 MCG/ACT nasal spray Place 1 spray into both nostrils 2 (two) times daily. 05/02/22   Particia Nearing, PA-C  Multiple Vitamin (MULTIVITAMIN WITH MINERALS) TABS tablet Take 1 tablet by mouth daily.    [provider]  mupirocin ointment (BACTROBAN) 2 % Apply 1 application topically 2 (two) times daily. 03/31/21   Particia Nearing, PA-C  ondansetron (ZOFRAN) 4 MG tablet Take 1 tablet (4 mg total) by mouth every 8 (eight) hours as needed for nausea or vomiting. Patient not taking: Reported on 04/25/2019 03/16/19   Michela Pitcher A, PA-C  tamsulosin (FLOMAX) 0.4 MG CAPS capsule Take 1 capsule (0.4 mg total) by mouth daily after supper. Patient not taking: Reported on 04/25/2019 03/16/19   Jeanie Sewer, PA-C    Family History History reviewed. No pertinent family history.  Social History Social History   Tobacco Use   Smoking status: Some Days   Smokeless tobacco: Current    Types: Chew  Vaping Use   Vaping Use: Every day   Substances: Nicotine  Substance Use Topics   Alcohol use: Yes    Alcohol/week: 10.0 standard drinks of alcohol    Types: 10 Shots of liquor per week    Comment: 3-4 times weekly    Drug use: Yes    Types: Marijuana     Allergies   Patient has no known allergies.   Review of Systems Review of Systems  HENT:  Positive for mouth sores.   Skin:  Positive for rash.  All other systems reviewed and are negative.    Physical Exam Triage Vital Signs ED Triage Vitals  Enc Vitals Group     BP 05/27/22 1528 (!) 154/73     Pulse Rate 05/27/22 1528 78     Resp 05/27/22 1528 18     Temp 05/27/22 1528 98.3 F (36.8 C)     Temp Source 05/27/22 1528 Oral     SpO2 05/27/22 1528 95 %     Weight --      Height --      Head Circumference --      Peak Flow --      Pain Score 05/27/22 1529 2     Pain Loc --      Pain Edu? --      Excl. in [place]? --    No data found.  Updated Vital Signs BP (!) 154/73 (BP Location: Right Arm)   Pulse 78   Temp 98.3 F (36.8 C) (Oral)   Resp 18   SpO2 95%   Visual Acuity Right Eye Distance:   Left Eye Distance:   Bilateral Distance:    Right Eye Near:   Left Eye Near:    Bilateral Near:     Physical  Exam Vitals and nursing note reviewed.  Constitutional:      Appearance: He is well-developed.  HENT:     Head: Normocephalic.     Mouth/Throat:     Comments: White appearing lesion left lower gum and mouth, dental decay Cardiovascular:     Rate and Rhythm: Normal rate.  Pulmonary:     Effort: Pulmonary effort is normal.  Abdominal:     General: There is no distension.  Musculoskeletal:        General: Normal range of motion.     Cervical back: Normal range of motion.  Skin:    Comments: Discolored rash full body consistent with tinea versicolor  Neurological:     Mental Status: He is alert and oriented to person, place, and time.      UC Treatments / Results  Labs (all labs ordered are listed, but only abnormal results are displayed) Labs Reviewed - No data to display  EKG   Radiology No results found.  Procedures Procedures (including critical care time)  Medications Ordered in UC Medications - No data to display  Initial Impression / Assessment and Plan / UC Course  I have reviewed the triage vital signs and the nursing notes.  Pertinent labs & imaging results that were available during my care of the patient were reviewed by me and considered in my medical decision making (see chart for details).     MDM patient is given a prescription for Diflucan for tinea versicolor.  I discussed with patient my concerns about oral lesion and the fact that he has been a oral tobacco user.  Patient is given the phone number for [facility] referral he is advised to schedule to see oral surgery for evaluation I have also advised him that seeing a local dentist would also be helpful.  I have discussed my concern for her with patient and he understands the importance of follow-up Final Clinical Impressions(s) / UC Diagnoses   Final diagnoses:  Oral lesion  Tinea versicolor     Discharge Instructions      Call [phone] to schedule an appointment with oral  surgery for evaluation.   It would also be helpful to see your local dentist.    ED Prescriptions     Medication Sig Dispense Auth. Provider   fluconazole (DIFLUCAN) 100 MG tablet Take 1  tablet (100 mg total) by mouth daily. 14 tablet Fransico Meadow, [place]      PDMP not reviewed this encounter. An After Visit Summary was printed and given to the patient.       Fransico Meadow, [place] 05/27/22 1616

## 2022-05-31 ENCOUNTER — Ambulatory Visit
Admission: EM | Admit: 2022-05-31 | Discharge: 2022-05-31 | Disposition: A | Discharge status: Home / Self Care | Payer: 59

## 2022-05-31 DIAGNOSIS — R030 Elevated blood-pressure reading, without diagnosis of hypertension: Secondary | ICD-10-CM | POA: Diagnosis not present

## 2022-05-31 DIAGNOSIS — F1420 Cocaine dependence, uncomplicated: Secondary | ICD-10-CM | POA: Diagnosis not present

## 2022-05-31 DIAGNOSIS — Z20828 Contact with and (suspected) exposure to other viral communicable diseases: Secondary | ICD-10-CM | POA: Diagnosis not present

## 2022-05-31 DIAGNOSIS — F1290 Cannabis use, unspecified, uncomplicated: Secondary | ICD-10-CM | POA: Diagnosis not present

## 2022-05-31 DIAGNOSIS — B360 Pityriasis versicolor: Secondary | ICD-10-CM | POA: Diagnosis not present

## 2022-05-31 DIAGNOSIS — J020 Streptococcal pharyngitis: Secondary | ICD-10-CM | POA: Diagnosis not present

## 2022-05-31 DIAGNOSIS — F1120 Opioid dependence, uncomplicated: Secondary | ICD-10-CM | POA: Diagnosis not present

## 2022-05-31 LAB — POCT RAPID STREP A (OFFICE): Rapid Strep A Screen: POSITIVE — AB

## 2022-05-31 MED ORDER — PSEUDOEPH-BROMPHEN-DM 30-2-10 MG/5ML PO SYRP: 5.0000 mL | ORAL_SOLUTION | Freq: Four times a day (QID) | ORAL | 0 refills | Status: DC | PRN

## 2022-05-31 MED ORDER — LIDOCAINE VISCOUS HCL 2 % MT SOLN: 5.0000 mL | Freq: Four times a day (QID) | OROMUCOSAL | 0 refills | Status: DC | PRN

## 2022-05-31 MED ORDER — AMOXICILLIN 500 MG PO CAPS: 500.0000 mg | ORAL_CAPSULE | Freq: Two times a day (BID) | ORAL | 0 refills | Status: AC

## 2022-05-31 NOTE — ED Triage Notes (Signed)
Pt reports fever 102.3 F; headache, left ear pain and sore throat x 2 days. Ibuprofen gives some relief.

## 2022-05-31 NOTE — Discharge Instructions (Addendum)
Rapid strep test was positive today. Take medication as prescribed. Increase fluids and allow for plenty of rest. Recommend over-the-counter Tylenol or Ibuprofen as needed for pain, fever, or general discomfort. Warm salt water gargles 3-4 times daily to help with throat pain or discomfort. Recommend a diet with soft foods to include soups, broths, puddings, yogurt, Jell-O's, or popsicles until symptoms improve. Change toothbrush after 3 days. Follow-up if symptoms do not improve.
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
## 2022-06-07 DIAGNOSIS — F1290 Cannabis use, unspecified, uncomplicated: Secondary | ICD-10-CM | POA: Diagnosis not present

## 2022-06-07 DIAGNOSIS — R030 Elevated blood-pressure reading, without diagnosis of hypertension: Secondary | ICD-10-CM | POA: Diagnosis not present

## 2022-06-07 DIAGNOSIS — F1420 Cocaine dependence, uncomplicated: Secondary | ICD-10-CM | POA: Diagnosis not present

## 2022-06-07 DIAGNOSIS — F1120 Opioid dependence, uncomplicated: Secondary | ICD-10-CM | POA: Diagnosis not present

## 2022-06-07 DIAGNOSIS — B360 Pityriasis versicolor: Secondary | ICD-10-CM | POA: Diagnosis not present

## 2022-06-14 DIAGNOSIS — R030 Elevated blood-pressure reading, without diagnosis of hypertension: Secondary | ICD-10-CM | POA: Diagnosis not present

## 2022-06-14 DIAGNOSIS — F1290 Cannabis use, unspecified, uncomplicated: Secondary | ICD-10-CM | POA: Diagnosis not present

## 2022-06-14 DIAGNOSIS — F1420 Cocaine dependence, uncomplicated: Secondary | ICD-10-CM | POA: Diagnosis not present

## 2022-06-14 DIAGNOSIS — R69 Illness, unspecified: Secondary | ICD-10-CM | POA: Diagnosis not present

## 2022-06-21 DIAGNOSIS — F1420 Cocaine dependence, uncomplicated: Secondary | ICD-10-CM | POA: Diagnosis not present

## 2022-06-21 DIAGNOSIS — R69 Illness, unspecified: Secondary | ICD-10-CM | POA: Diagnosis not present

## 2022-06-21 DIAGNOSIS — B360 Pityriasis versicolor: Secondary | ICD-10-CM | POA: Diagnosis not present

## 2022-06-21 DIAGNOSIS — R030 Elevated blood-pressure reading, without diagnosis of hypertension: Secondary | ICD-10-CM | POA: Diagnosis not present

## 2022-06-21 DIAGNOSIS — F1290 Cannabis use, unspecified, uncomplicated: Secondary | ICD-10-CM | POA: Diagnosis not present

## 2022-06-28 DIAGNOSIS — R030 Elevated blood-pressure reading, without diagnosis of hypertension: Secondary | ICD-10-CM | POA: Diagnosis not present

## 2022-06-28 DIAGNOSIS — F1420 Cocaine dependence, uncomplicated: Secondary | ICD-10-CM | POA: Diagnosis not present

## 2022-06-28 DIAGNOSIS — B360 Pityriasis versicolor: Secondary | ICD-10-CM | POA: Diagnosis not present

## 2022-06-28 DIAGNOSIS — R69 Illness, unspecified: Secondary | ICD-10-CM | POA: Diagnosis not present

## 2022-06-28 DIAGNOSIS — F1120 Opioid dependence, uncomplicated: Secondary | ICD-10-CM | POA: Diagnosis not present

## 2022-06-28 DIAGNOSIS — F1290 Cannabis use, unspecified, uncomplicated: Secondary | ICD-10-CM | POA: Diagnosis not present

## 2022-07-05 DIAGNOSIS — F1420 Cocaine dependence, uncomplicated: Secondary | ICD-10-CM | POA: Diagnosis not present

## 2022-07-05 DIAGNOSIS — B360 Pityriasis versicolor: Secondary | ICD-10-CM | POA: Diagnosis not present

## 2022-07-05 DIAGNOSIS — F1290 Cannabis use, unspecified, uncomplicated: Secondary | ICD-10-CM | POA: Diagnosis not present

## 2022-07-05 DIAGNOSIS — F1120 Opioid dependence, uncomplicated: Secondary | ICD-10-CM | POA: Diagnosis not present

## 2022-07-05 DIAGNOSIS — R030 Elevated blood-pressure reading, without diagnosis of hypertension: Secondary | ICD-10-CM | POA: Diagnosis not present

## 2022-07-12 DIAGNOSIS — R030 Elevated blood-pressure reading, without diagnosis of hypertension: Secondary | ICD-10-CM | POA: Diagnosis not present

## 2022-07-12 DIAGNOSIS — F1420 Cocaine dependence, uncomplicated: Secondary | ICD-10-CM | POA: Diagnosis not present

## 2022-07-12 DIAGNOSIS — F1120 Opioid dependence, uncomplicated: Secondary | ICD-10-CM | POA: Diagnosis not present

## 2022-07-12 DIAGNOSIS — F1290 Cannabis use, unspecified, uncomplicated: Secondary | ICD-10-CM | POA: Diagnosis not present

## 2022-07-12 DIAGNOSIS — B360 Pityriasis versicolor: Secondary | ICD-10-CM | POA: Diagnosis not present

## 2022-07-19 DIAGNOSIS — F1120 Opioid dependence, uncomplicated: Secondary | ICD-10-CM | POA: Diagnosis not present

## 2022-07-19 DIAGNOSIS — F1420 Cocaine dependence, uncomplicated: Secondary | ICD-10-CM | POA: Diagnosis not present

## 2022-07-19 DIAGNOSIS — R030 Elevated blood-pressure reading, without diagnosis of hypertension: Secondary | ICD-10-CM | POA: Diagnosis not present

## 2022-07-19 DIAGNOSIS — B360 Pityriasis versicolor: Secondary | ICD-10-CM | POA: Diagnosis not present

## 2022-07-19 DIAGNOSIS — F1290 Cannabis use, unspecified, uncomplicated: Secondary | ICD-10-CM | POA: Diagnosis not present

## 2022-07-26 DIAGNOSIS — F1290 Cannabis use, unspecified, uncomplicated: Secondary | ICD-10-CM | POA: Diagnosis not present

## 2022-07-26 DIAGNOSIS — B360 Pityriasis versicolor: Secondary | ICD-10-CM | POA: Diagnosis not present

## 2022-07-26 DIAGNOSIS — R030 Elevated blood-pressure reading, without diagnosis of hypertension: Secondary | ICD-10-CM | POA: Diagnosis not present

## 2022-07-26 DIAGNOSIS — F1120 Opioid dependence, uncomplicated: Secondary | ICD-10-CM | POA: Diagnosis not present

## 2022-07-26 DIAGNOSIS — F1420 Cocaine dependence, uncomplicated: Secondary | ICD-10-CM | POA: Diagnosis not present

## 2022-08-02 DIAGNOSIS — R030 Elevated blood-pressure reading, without diagnosis of hypertension: Secondary | ICD-10-CM | POA: Diagnosis not present

## 2022-08-02 DIAGNOSIS — B360 Pityriasis versicolor: Secondary | ICD-10-CM | POA: Diagnosis not present

## 2022-08-02 DIAGNOSIS — F1120 Opioid dependence, uncomplicated: Secondary | ICD-10-CM | POA: Diagnosis not present

## 2022-08-02 DIAGNOSIS — F1420 Cocaine dependence, uncomplicated: Secondary | ICD-10-CM | POA: Diagnosis not present

## 2022-08-02 DIAGNOSIS — F1290 Cannabis use, unspecified, uncomplicated: Secondary | ICD-10-CM | POA: Diagnosis not present

## 2022-08-09 DIAGNOSIS — F1120 Opioid dependence, uncomplicated: Secondary | ICD-10-CM | POA: Diagnosis not present

## 2022-08-10 DIAGNOSIS — F1290 Cannabis use, unspecified, uncomplicated: Secondary | ICD-10-CM | POA: Diagnosis not present

## 2022-08-10 DIAGNOSIS — R030 Elevated blood-pressure reading, without diagnosis of hypertension: Secondary | ICD-10-CM | POA: Diagnosis not present

## 2022-08-10 DIAGNOSIS — F1120 Opioid dependence, uncomplicated: Secondary | ICD-10-CM | POA: Diagnosis not present

## 2022-08-10 DIAGNOSIS — F1420 Cocaine dependence, uncomplicated: Secondary | ICD-10-CM | POA: Diagnosis not present

## 2022-08-10 DIAGNOSIS — B360 Pityriasis versicolor: Secondary | ICD-10-CM | POA: Diagnosis not present

## 2022-08-16 DIAGNOSIS — B360 Pityriasis versicolor: Secondary | ICD-10-CM | POA: Diagnosis not present

## 2022-08-16 DIAGNOSIS — F1290 Cannabis use, unspecified, uncomplicated: Secondary | ICD-10-CM | POA: Diagnosis not present

## 2022-08-16 DIAGNOSIS — F1420 Cocaine dependence, uncomplicated: Secondary | ICD-10-CM | POA: Diagnosis not present

## 2022-08-16 DIAGNOSIS — F1120 Opioid dependence, uncomplicated: Secondary | ICD-10-CM | POA: Diagnosis not present

## 2022-08-16 DIAGNOSIS — R030 Elevated blood-pressure reading, without diagnosis of hypertension: Secondary | ICD-10-CM | POA: Diagnosis not present

## 2022-08-23 DIAGNOSIS — F1120 Opioid dependence, uncomplicated: Secondary | ICD-10-CM | POA: Diagnosis not present

## 2022-08-23 DIAGNOSIS — F1290 Cannabis use, unspecified, uncomplicated: Secondary | ICD-10-CM | POA: Diagnosis not present

## 2022-08-23 DIAGNOSIS — Z7289 Other problems related to lifestyle: Secondary | ICD-10-CM | POA: Diagnosis not present

## 2022-08-23 DIAGNOSIS — B360 Pityriasis versicolor: Secondary | ICD-10-CM | POA: Diagnosis not present

## 2022-08-23 DIAGNOSIS — F1420 Cocaine dependence, uncomplicated: Secondary | ICD-10-CM | POA: Diagnosis not present

## 2022-08-23 DIAGNOSIS — R030 Elevated blood-pressure reading, without diagnosis of hypertension: Secondary | ICD-10-CM | POA: Diagnosis not present

## 2022-08-30 DIAGNOSIS — R030 Elevated blood-pressure reading, without diagnosis of hypertension: Secondary | ICD-10-CM | POA: Diagnosis not present

## 2022-08-30 DIAGNOSIS — F1120 Opioid dependence, uncomplicated: Secondary | ICD-10-CM | POA: Diagnosis not present

## 2022-08-30 DIAGNOSIS — F1290 Cannabis use, unspecified, uncomplicated: Secondary | ICD-10-CM | POA: Diagnosis not present

## 2022-08-30 DIAGNOSIS — F1420 Cocaine dependence, uncomplicated: Secondary | ICD-10-CM | POA: Diagnosis not present

## 2022-08-31 DIAGNOSIS — F1120 Opioid dependence, uncomplicated: Secondary | ICD-10-CM | POA: Diagnosis not present

## 2022-09-06 DIAGNOSIS — F1420 Cocaine dependence, uncomplicated: Secondary | ICD-10-CM | POA: Diagnosis not present

## 2022-09-06 DIAGNOSIS — F1290 Cannabis use, unspecified, uncomplicated: Secondary | ICD-10-CM | POA: Diagnosis not present

## 2022-09-06 DIAGNOSIS — Z7289 Other problems related to lifestyle: Secondary | ICD-10-CM | POA: Diagnosis not present

## 2022-09-06 DIAGNOSIS — F1120 Opioid dependence, uncomplicated: Secondary | ICD-10-CM | POA: Diagnosis not present

## 2022-09-06 DIAGNOSIS — B360 Pityriasis versicolor: Secondary | ICD-10-CM | POA: Diagnosis not present

## 2022-09-06 DIAGNOSIS — R030 Elevated blood-pressure reading, without diagnosis of hypertension: Secondary | ICD-10-CM | POA: Diagnosis not present

## 2022-09-13 DIAGNOSIS — R030 Elevated blood-pressure reading, without diagnosis of hypertension: Secondary | ICD-10-CM | POA: Diagnosis not present

## 2022-09-13 DIAGNOSIS — B360 Pityriasis versicolor: Secondary | ICD-10-CM | POA: Diagnosis not present

## 2022-09-13 DIAGNOSIS — F1420 Cocaine dependence, uncomplicated: Secondary | ICD-10-CM | POA: Diagnosis not present

## 2022-09-13 DIAGNOSIS — F1290 Cannabis use, unspecified, uncomplicated: Secondary | ICD-10-CM | POA: Diagnosis not present

## 2022-09-13 DIAGNOSIS — Z7289 Other problems related to lifestyle: Secondary | ICD-10-CM | POA: Diagnosis not present

## 2022-09-13 DIAGNOSIS — F1120 Opioid dependence, uncomplicated: Secondary | ICD-10-CM | POA: Diagnosis not present

## 2022-09-14 DIAGNOSIS — F1120 Opioid dependence, uncomplicated: Secondary | ICD-10-CM | POA: Diagnosis not present

## 2022-09-20 DIAGNOSIS — B360 Pityriasis versicolor: Secondary | ICD-10-CM | POA: Diagnosis not present

## 2022-09-20 DIAGNOSIS — F1420 Cocaine dependence, uncomplicated: Secondary | ICD-10-CM | POA: Diagnosis not present

## 2022-09-20 DIAGNOSIS — F1290 Cannabis use, unspecified, uncomplicated: Secondary | ICD-10-CM | POA: Diagnosis not present

## 2022-09-20 DIAGNOSIS — R030 Elevated blood-pressure reading, without diagnosis of hypertension: Secondary | ICD-10-CM | POA: Diagnosis not present

## 2022-09-20 DIAGNOSIS — F1120 Opioid dependence, uncomplicated: Secondary | ICD-10-CM | POA: Diagnosis not present

## 2022-09-27 DIAGNOSIS — R030 Elevated blood-pressure reading, without diagnosis of hypertension: Secondary | ICD-10-CM | POA: Diagnosis not present

## 2022-09-27 DIAGNOSIS — F1420 Cocaine dependence, uncomplicated: Secondary | ICD-10-CM | POA: Diagnosis not present

## 2022-09-27 DIAGNOSIS — F1290 Cannabis use, unspecified, uncomplicated: Secondary | ICD-10-CM | POA: Diagnosis not present

## 2022-09-27 DIAGNOSIS — B360 Pityriasis versicolor: Secondary | ICD-10-CM | POA: Diagnosis not present

## 2022-09-27 DIAGNOSIS — F1120 Opioid dependence, uncomplicated: Secondary | ICD-10-CM | POA: Diagnosis not present

## 2022-10-05 DIAGNOSIS — F1120 Opioid dependence, uncomplicated: Secondary | ICD-10-CM | POA: Diagnosis not present

## 2022-10-05 DIAGNOSIS — F1420 Cocaine dependence, uncomplicated: Secondary | ICD-10-CM | POA: Diagnosis not present

## 2022-10-05 DIAGNOSIS — F1290 Cannabis use, unspecified, uncomplicated: Secondary | ICD-10-CM | POA: Diagnosis not present

## 2022-10-05 DIAGNOSIS — R030 Elevated blood-pressure reading, without diagnosis of hypertension: Secondary | ICD-10-CM | POA: Diagnosis not present

## 2022-10-05 DIAGNOSIS — B360 Pityriasis versicolor: Secondary | ICD-10-CM | POA: Diagnosis not present

## 2022-10-12 DIAGNOSIS — F1120 Opioid dependence, uncomplicated: Secondary | ICD-10-CM | POA: Diagnosis not present

## 2022-10-12 DIAGNOSIS — R030 Elevated blood-pressure reading, without diagnosis of hypertension: Secondary | ICD-10-CM | POA: Diagnosis not present

## 2022-10-12 DIAGNOSIS — F1420 Cocaine dependence, uncomplicated: Secondary | ICD-10-CM | POA: Diagnosis not present

## 2022-10-12 DIAGNOSIS — F1290 Cannabis use, unspecified, uncomplicated: Secondary | ICD-10-CM | POA: Diagnosis not present

## 2022-10-12 DIAGNOSIS — B360 Pityriasis versicolor: Secondary | ICD-10-CM | POA: Diagnosis not present

## 2022-10-18 DIAGNOSIS — R030 Elevated blood-pressure reading, without diagnosis of hypertension: Secondary | ICD-10-CM | POA: Diagnosis not present

## 2022-10-18 DIAGNOSIS — F1120 Opioid dependence, uncomplicated: Secondary | ICD-10-CM | POA: Diagnosis not present

## 2022-10-18 DIAGNOSIS — F1420 Cocaine dependence, uncomplicated: Secondary | ICD-10-CM | POA: Diagnosis not present

## 2022-10-18 DIAGNOSIS — F1290 Cannabis use, unspecified, uncomplicated: Secondary | ICD-10-CM | POA: Diagnosis not present

## 2022-10-18 DIAGNOSIS — B360 Pityriasis versicolor: Secondary | ICD-10-CM | POA: Diagnosis not present

## 2022-10-25 DIAGNOSIS — F1290 Cannabis use, unspecified, uncomplicated: Secondary | ICD-10-CM | POA: Diagnosis not present

## 2022-10-25 DIAGNOSIS — F1420 Cocaine dependence, uncomplicated: Secondary | ICD-10-CM | POA: Diagnosis not present

## 2022-10-25 DIAGNOSIS — F1120 Opioid dependence, uncomplicated: Secondary | ICD-10-CM | POA: Diagnosis not present

## 2022-10-25 DIAGNOSIS — R030 Elevated blood-pressure reading, without diagnosis of hypertension: Secondary | ICD-10-CM | POA: Diagnosis not present

## 2022-11-04 DIAGNOSIS — F1120 Opioid dependence, uncomplicated: Secondary | ICD-10-CM | POA: Diagnosis not present

## 2022-11-04 DIAGNOSIS — F1420 Cocaine dependence, uncomplicated: Secondary | ICD-10-CM | POA: Diagnosis not present

## 2022-11-04 DIAGNOSIS — F1290 Cannabis use, unspecified, uncomplicated: Secondary | ICD-10-CM | POA: Diagnosis not present

## 2022-11-04 DIAGNOSIS — Z7289 Other problems related to lifestyle: Secondary | ICD-10-CM | POA: Diagnosis not present

## 2022-12-01 DIAGNOSIS — F1420 Cocaine dependence, uncomplicated: Secondary | ICD-10-CM | POA: Diagnosis not present

## 2022-12-01 DIAGNOSIS — R030 Elevated blood-pressure reading, without diagnosis of hypertension: Secondary | ICD-10-CM | POA: Diagnosis not present

## 2022-12-01 DIAGNOSIS — F1120 Opioid dependence, uncomplicated: Secondary | ICD-10-CM | POA: Diagnosis not present

## 2022-12-01 DIAGNOSIS — F1290 Cannabis use, unspecified, uncomplicated: Secondary | ICD-10-CM | POA: Diagnosis not present

## 2022-12-17 DIAGNOSIS — F1120 Opioid dependence, uncomplicated: Secondary | ICD-10-CM | POA: Diagnosis not present

## 2022-12-20 DIAGNOSIS — F1120 Opioid dependence, uncomplicated: Secondary | ICD-10-CM | POA: Diagnosis not present

## 2022-12-20 DIAGNOSIS — F1420 Cocaine dependence, uncomplicated: Secondary | ICD-10-CM | POA: Diagnosis not present

## 2022-12-20 DIAGNOSIS — J329 Chronic sinusitis, unspecified: Secondary | ICD-10-CM | POA: Diagnosis not present

## 2022-12-20 DIAGNOSIS — F1290 Cannabis use, unspecified, uncomplicated: Secondary | ICD-10-CM | POA: Diagnosis not present

## 2022-12-24 ENCOUNTER — Encounter: Payer: Self-pay | Admitting: Emergency Medicine

## 2022-12-24 ENCOUNTER — Ambulatory Visit
Admission: EM | Admit: 2022-12-24 | Discharge: 2022-12-24 | Disposition: A | Discharge status: Home / Self Care | Payer: 59 | Attending: Family Medicine | Admitting: Family Medicine

## 2022-12-24 ENCOUNTER — Telehealth: Payer: Self-pay | Admitting: Family Medicine

## 2022-12-24 DIAGNOSIS — B889 Infestation, unspecified: Secondary | ICD-10-CM | POA: Diagnosis not present

## 2022-12-24 MED ORDER — PERMETHRIN 5 % EX CREA: TOPICAL_CREAM | CUTANEOUS | 3 refills | Status: AC

## 2022-12-24 MED ORDER — PREDNISONE 20 MG PO TABS: 40.0000 mg | ORAL_TABLET | Freq: Every day | ORAL | 0 refills | Status: AC

## 2022-12-24 NOTE — Telephone Encounter (Signed)
error 

## 2022-12-24 NOTE — ED Triage Notes (Signed)
Insect bites all over body x 1.5 weeks  States he has black specs all over.  States areas itch.  States when he blows his nose, he see the little black specs.
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
## 2023-01-04 ENCOUNTER — Emergency Department (HOSPITAL_COMMUNITY)
Admission: EM | Admit: 2023-01-04 | Discharge: 2023-01-04 | Disposition: A | Discharge status: Home / Self Care | Payer: 59 | Attending: Emergency Medicine | Admitting: Emergency Medicine

## 2023-01-04 ENCOUNTER — Other Ambulatory Visit: Payer: Self-pay

## 2023-01-04 ENCOUNTER — Encounter (HOSPITAL_COMMUNITY): Payer: Self-pay | Admitting: Emergency Medicine

## 2023-01-04 DIAGNOSIS — B9689 Other specified bacterial agents as the cause of diseases classified elsewhere: Secondary | ICD-10-CM | POA: Diagnosis not present

## 2023-01-04 DIAGNOSIS — R0981 Nasal congestion: Secondary | ICD-10-CM | POA: Diagnosis not present

## 2023-01-04 DIAGNOSIS — F1420 Cocaine dependence, uncomplicated: Secondary | ICD-10-CM | POA: Diagnosis not present

## 2023-01-04 DIAGNOSIS — J019 Acute sinusitis, unspecified: Secondary | ICD-10-CM | POA: Diagnosis not present

## 2023-01-04 DIAGNOSIS — K0889 Other specified disorders of teeth and supporting structures: Secondary | ICD-10-CM | POA: Insufficient documentation

## 2023-01-04 DIAGNOSIS — F1290 Cannabis use, unspecified, uncomplicated: Secondary | ICD-10-CM | POA: Diagnosis not present

## 2023-01-04 DIAGNOSIS — Z7289 Other problems related to lifestyle: Secondary | ICD-10-CM | POA: Diagnosis not present

## 2023-01-04 DIAGNOSIS — F1120 Opioid dependence, uncomplicated: Secondary | ICD-10-CM | POA: Diagnosis not present

## 2023-01-04 MED ORDER — AMOXICILLIN-POT CLAVULANATE 875-125 MG PO TABS
1.0000 | ORAL_TABLET | Freq: Once | ORAL | Status: AC
  Administered 2023-01-04: 1 via ORAL
  Filled 2023-01-04: qty 1

## 2023-01-04 MED ORDER — AMOXICILLIN-POT CLAVULANATE 875-125 MG PO TABS: 1.0000 | ORAL_TABLET | Freq: Two times a day (BID) | ORAL | 0 refills | Status: AC

## 2023-01-04 NOTE — ED Triage Notes (Signed)
Pt states sinuses are bothering him. States his hole face hurts x 2 weeks. States when he blows his nose he sees things moving in his nasal mucus. Pt states "I think I have some parasite in my sinuses". Pt a/o. Nad in triage. C/o wet cough as well. No resp distress/shob noted

## 2023-01-04 NOTE — ED Provider Notes (Signed)
[place] EMERGENCY DEPARTMENT AT [facility] Provider Note   CSN: [id number] Arrival date & time: 01/04/23  1940     History  No chief complaint on file.   John Brock is a 32 y.o. male.  HPI 32 year old male presents with 2 weeks of progressive symptoms that started out with nasal congestion and has progressed to increasing pain and pressure over his bilateral cheeks, pain in the upper teeth, there is no fevers or chills but he is blowing out thick phlegm that is often colored and has blood-tinged to it.  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    Home Medications Prior to Admission medications   Medication Sig Start Date End Date Taking? Authorizing Provider  amoxicillin-clavulanate (AUGMENTIN) 875-125 MG tablet Take 1 tablet by mouth every 12 (twelve) hours. 01/04/23  Yes Eber Hong, MD  Buprenorphine HCl-Naloxone HCl 8-2 MG FILM SMARTSIG:0.5 Strip(s) Sublingual 3 Times Daily 08/11/21   [provider]  ibuprofen (ADVIL) 200 MG tablet Take 200 mg by mouth every 6 (six) hours as needed.    [provider]  Multiple Vitamin (MULTIVITAMIN WITH MINERALS) TABS tablet Take 1 tablet by mouth daily.    [provider]  permethrin (ELIMITE) 5 % cream Apply to affected area once at bedtime, leave for at least 8 hours and then rinse off in shower. Repeat weekly until resolved 12/24/22   Particia Nearing, PA-C  predniSONE (DELTASONE) 20 MG tablet Take 2 tablets (40 mg total) by mouth daily with breakfast. 12/24/22   Particia Nearing, PA-C      Allergies    Patient has no known allergies.    Review of Systems   Review of Systems  Constitutional:  Negative for fever.  HENT:  Positive for rhinorrhea. Negative for facial swelling.     Physical Exam Updated Vital Signs BP (!) 168/97 (BP Location: Right Arm)    Pulse 81   Temp 98.7 F (37.1 C) (Oral)   Resp 17   SpO2 100%  Physical Exam Vitals and nursing note reviewed.  Constitutional:      Appearance: He is well-developed. He is not diaphoretic.  HENT:     Head: Normocephalic and atraumatic.     Nose:     Comments: Turbinate swelling, mild rhinorrhea    Mouth/Throat:     Comments: Clear oropharynx Eyes:     General:        Right eye: No discharge.        Left eye: No discharge.     Conjunctiva/sclera: Conjunctivae normal.  Pulmonary:     Effort: Pulmonary effort is normal. No respiratory distress.  Skin:    General: Skin is warm and dry.     Findings: No erythema or rash.  Neurological:     Mental Status: He is alert.     Coordination: Coordination normal.     ED Results / Procedures / Treatments   Labs (all labs ordered are listed, but only abnormal results are displayed) Labs Reviewed - No data to display  EKG None  Radiology No results found.  Procedures Procedures    Medications Ordered in ED Medications  amoxicillin-clavulanate (AUGMENTIN) 875-125 MG per tablet 1 tablet (has no administration in time range)    ED Course/ Medical Decision Making/ A&P  Medical Decision Making  Mild tenderness over the sinuses Has likely bacterial sinusitis given over 14 days of symptoms gradually worsening without any improvement.  Thankfully vital signs were unremarkable except for some hypertension, will give first dose of Augmentin and home with the same, explained in detail why he needs to use decongestants to drain the sinuses.  He is agreeable to the plan.  First dose given prior to discharge        Final Clinical Impression(s) / ED Diagnoses Final diagnoses:  Acute bacterial sinusitis    Rx / DC Orders ED Discharge Orders          Ordered    amoxicillin-clavulanate (AUGMENTIN) 875-125 MG tablet  Every 12 hours        01/04/23 2014              Eber Hong,  MD 01/04/23 2015

## 2023-01-04 NOTE — Discharge Instructions (Signed)
Please use the following medications to help with your symptoms  Augmentin twice a day for 7 days  Sudafed  Flonase nasal spray  Thank you for allowing [place] to treat you in the emergency department today.  After reviewing your examination and potential testing that was done it appears that you are safe to go home.  I would like for you to follow-up with your doctor within the next several days, have them obtain your results and follow-up with them to review all of these tests.  If you should develop severe or worsening symptoms return to the emergency department immediately

## 2023-01-19 DIAGNOSIS — F1290 Cannabis use, unspecified, uncomplicated: Secondary | ICD-10-CM | POA: Diagnosis not present

## 2023-01-19 DIAGNOSIS — F1120 Opioid dependence, uncomplicated: Secondary | ICD-10-CM | POA: Diagnosis not present

## 2023-01-19 DIAGNOSIS — F1420 Cocaine dependence, uncomplicated: Secondary | ICD-10-CM | POA: Diagnosis not present

## 2023-01-19 DIAGNOSIS — J329 Chronic sinusitis, unspecified: Secondary | ICD-10-CM | POA: Diagnosis not present

## 2023-01-20 DIAGNOSIS — J329 Chronic sinusitis, unspecified: Secondary | ICD-10-CM | POA: Diagnosis not present

## 2023-01-20 DIAGNOSIS — F1420 Cocaine dependence, uncomplicated: Secondary | ICD-10-CM | POA: Diagnosis not present

## 2023-01-20 DIAGNOSIS — F1290 Cannabis use, unspecified, uncomplicated: Secondary | ICD-10-CM | POA: Diagnosis not present

## 2023-01-20 DIAGNOSIS — F1120 Opioid dependence, uncomplicated: Secondary | ICD-10-CM | POA: Diagnosis not present

## 2023-01-25 DIAGNOSIS — F1290 Cannabis use, unspecified, uncomplicated: Secondary | ICD-10-CM | POA: Diagnosis not present

## 2023-01-25 DIAGNOSIS — R030 Elevated blood-pressure reading, without diagnosis of hypertension: Secondary | ICD-10-CM | POA: Diagnosis not present

## 2023-01-25 DIAGNOSIS — F1420 Cocaine dependence, uncomplicated: Secondary | ICD-10-CM | POA: Diagnosis not present

## 2023-01-25 DIAGNOSIS — J329 Chronic sinusitis, unspecified: Secondary | ICD-10-CM | POA: Diagnosis not present

## 2023-01-25 DIAGNOSIS — F1120 Opioid dependence, uncomplicated: Secondary | ICD-10-CM | POA: Diagnosis not present

## 2023-02-08 DIAGNOSIS — F1420 Cocaine dependence, uncomplicated: Secondary | ICD-10-CM | POA: Diagnosis not present

## 2023-02-08 DIAGNOSIS — J329 Chronic sinusitis, unspecified: Secondary | ICD-10-CM | POA: Diagnosis not present

## 2023-02-08 DIAGNOSIS — R030 Elevated blood-pressure reading, without diagnosis of hypertension: Secondary | ICD-10-CM | POA: Diagnosis not present

## 2023-02-08 DIAGNOSIS — F1290 Cannabis use, unspecified, uncomplicated: Secondary | ICD-10-CM | POA: Diagnosis not present

## 2023-02-08 DIAGNOSIS — F1120 Opioid dependence, uncomplicated: Secondary | ICD-10-CM | POA: Diagnosis not present

## 2023-02-14 DIAGNOSIS — F1290 Cannabis use, unspecified, uncomplicated: Secondary | ICD-10-CM | POA: Diagnosis not present

## 2023-02-14 DIAGNOSIS — J329 Chronic sinusitis, unspecified: Secondary | ICD-10-CM | POA: Diagnosis not present

## 2023-02-14 DIAGNOSIS — F1120 Opioid dependence, uncomplicated: Secondary | ICD-10-CM | POA: Diagnosis not present

## 2023-02-14 DIAGNOSIS — F1420 Cocaine dependence, uncomplicated: Secondary | ICD-10-CM | POA: Diagnosis not present

## 2023-02-28 DIAGNOSIS — J329 Chronic sinusitis, unspecified: Secondary | ICD-10-CM | POA: Diagnosis not present

## 2023-02-28 DIAGNOSIS — F1290 Cannabis use, unspecified, uncomplicated: Secondary | ICD-10-CM | POA: Diagnosis not present

## 2023-02-28 DIAGNOSIS — B360 Pityriasis versicolor: Secondary | ICD-10-CM | POA: Diagnosis not present

## 2023-02-28 DIAGNOSIS — F1120 Opioid dependence, uncomplicated: Secondary | ICD-10-CM | POA: Diagnosis not present

## 2023-03-07 DIAGNOSIS — F1120 Opioid dependence, uncomplicated: Secondary | ICD-10-CM | POA: Diagnosis not present

## 2023-03-07 DIAGNOSIS — J329 Chronic sinusitis, unspecified: Secondary | ICD-10-CM | POA: Diagnosis not present

## 2023-03-07 DIAGNOSIS — B360 Pityriasis versicolor: Secondary | ICD-10-CM | POA: Diagnosis not present

## 2023-03-07 DIAGNOSIS — R030 Elevated blood-pressure reading, without diagnosis of hypertension: Secondary | ICD-10-CM | POA: Diagnosis not present

## 2023-03-15 DIAGNOSIS — B360 Pityriasis versicolor: Secondary | ICD-10-CM | POA: Diagnosis not present

## 2023-03-15 DIAGNOSIS — F1120 Opioid dependence, uncomplicated: Secondary | ICD-10-CM | POA: Diagnosis not present

## 2023-03-15 DIAGNOSIS — F1420 Cocaine dependence, uncomplicated: Secondary | ICD-10-CM | POA: Diagnosis not present

## 2023-03-15 DIAGNOSIS — J329 Chronic sinusitis, unspecified: Secondary | ICD-10-CM | POA: Diagnosis not present

## 2023-03-22 DIAGNOSIS — F1120 Opioid dependence, uncomplicated: Secondary | ICD-10-CM | POA: Diagnosis not present

## 2023-04-04 DIAGNOSIS — F909 Attention-deficit hyperactivity disorder, unspecified type: Secondary | ICD-10-CM | POA: Diagnosis not present

## 2023-04-04 DIAGNOSIS — F1120 Opioid dependence, uncomplicated: Secondary | ICD-10-CM | POA: Diagnosis not present

## 2023-04-04 DIAGNOSIS — Z7289 Other problems related to lifestyle: Secondary | ICD-10-CM | POA: Diagnosis not present

## 2023-04-04 DIAGNOSIS — R030 Elevated blood-pressure reading, without diagnosis of hypertension: Secondary | ICD-10-CM | POA: Diagnosis not present

## 2023-04-11 DIAGNOSIS — F909 Attention-deficit hyperactivity disorder, unspecified type: Secondary | ICD-10-CM | POA: Diagnosis not present

## 2023-04-11 DIAGNOSIS — R030 Elevated blood-pressure reading, without diagnosis of hypertension: Secondary | ICD-10-CM | POA: Diagnosis not present

## 2023-04-11 DIAGNOSIS — F1120 Opioid dependence, uncomplicated: Secondary | ICD-10-CM | POA: Diagnosis not present

## 2023-04-11 DIAGNOSIS — Z7289 Other problems related to lifestyle: Secondary | ICD-10-CM | POA: Diagnosis not present

## 2023-04-18 DIAGNOSIS — Z7289 Other problems related to lifestyle: Secondary | ICD-10-CM | POA: Diagnosis not present

## 2023-04-18 DIAGNOSIS — F1120 Opioid dependence, uncomplicated: Secondary | ICD-10-CM | POA: Diagnosis not present

## 2023-04-18 DIAGNOSIS — R030 Elevated blood-pressure reading, without diagnosis of hypertension: Secondary | ICD-10-CM | POA: Diagnosis not present

## 2023-04-18 DIAGNOSIS — F909 Attention-deficit hyperactivity disorder, unspecified type: Secondary | ICD-10-CM | POA: Diagnosis not present

## 2023-05-01 ENCOUNTER — Emergency Department (HOSPITAL_COMMUNITY)
Admission: EM | Admit: 2023-05-01 | Discharge: 2023-05-01 | Disposition: A | Discharge status: Home / Self Care | Payer: 59 | Attending: Emergency Medicine | Admitting: Emergency Medicine

## 2023-05-01 ENCOUNTER — Encounter (HOSPITAL_COMMUNITY): Payer: Self-pay

## 2023-05-01 ENCOUNTER — Other Ambulatory Visit: Payer: Self-pay

## 2023-05-01 DIAGNOSIS — H9201 Otalgia, right ear: Secondary | ICD-10-CM | POA: Diagnosis not present

## 2023-05-01 DIAGNOSIS — H66003 Acute suppurative otitis media without spontaneous rupture of ear drum, bilateral: Secondary | ICD-10-CM | POA: Diagnosis not present

## 2023-05-01 DIAGNOSIS — H6693 Otitis media, unspecified, bilateral: Secondary | ICD-10-CM | POA: Diagnosis not present

## 2023-05-01 MED ORDER — AZITHROMYCIN 250 MG PO TABS: 250.0000 mg | ORAL_TABLET | Freq: Every day | ORAL | 0 refills | Status: AC

## 2023-05-01 MED ORDER — AZITHROMYCIN 250 MG PO TABS
500.0000 mg | ORAL_TABLET | Freq: Once | ORAL | Status: AC
  Administered 2023-05-01: 500 mg via ORAL
  Filled 2023-05-01: qty 2

## 2023-05-01 NOTE — ED Provider Notes (Signed)
[place] EMERGENCY DEPARTMENT AT [facility] Provider Note   CSN: [id number] Arrival date & time: 05/01/23  2956     History  Chief Complaint  Patient presents with   Otalgia    John Brock is a 32 y.o. male.  Patient is a 32 year old male presenting with complaints of right ear pain.  He reports URI symptoms for the past several days.  He reports blowing his nose forcefully, then both ears began to hurt, right greater than left.  No fevers or chills.  His hearing is somewhat muffled.  The history is provided by the patient.       Home Medications Prior to Admission medications   Medication Sig Start Date End Date Taking? Authorizing Provider  Buprenorphine HCl-Naloxone HCl 8-2 MG FILM SMARTSIG:0.5 Strip(s) Sublingual 3 Times Daily 08/11/21  Yes [provider]  ibuprofen (ADVIL) 200 MG tablet Take 200 mg by mouth every 6 (six) hours as needed.   Yes [provider]  lisdexamfetamine (VYVANSE) 50 MG capsule Take 50 mg by mouth every morning. 04/18/23  Yes [provider]  Multiple Vitamin (MULTIVITAMIN WITH MINERALS) TABS tablet Take 1 tablet by mouth daily.   Yes [provider]  amoxicillin-clavulanate (AUGMENTIN) 875-125 MG tablet Take 1 tablet by mouth every 12 (twelve) hours. 01/04/23   Eber Hong, MD  permethrin (ELIMITE) 5 % cream Apply to affected area once at bedtime, leave for at least 8 hours and then rinse off in shower. Repeat weekly until resolved 12/24/22   Particia Nearing, PA-C  predniSONE (DELTASONE) 20 MG tablet Take 2 tablets (40 mg total) by mouth daily with breakfast. 12/24/22   Particia Nearing, PA-C      Allergies    Patient has no known allergies.    Review of Systems   Review of Systems  All other systems reviewed and are negative.   Physical Exam Updated Vital Signs BP (!) 149/85 (BP Location: Left Arm)   Pulse 100   Temp 98.3 F (36.8 C) (Oral)   Resp 20   Ht 5\' 7"  (1.702 m)    Wt 109 kg   SpO2 98%   BMI 37.64 kg/m  Physical Exam Vitals and nursing note reviewed.  Constitutional:      Appearance: Normal appearance.  HENT:     Head: Normocephalic and atraumatic.     Ears:     Comments: Bilateral TMs are erythematous and bulging.  There is no obvious perforation visible.    Mouth/Throat:     Mouth: Mucous membranes are moist.     Pharynx: No oropharyngeal exudate or posterior oropharyngeal erythema.  Pulmonary:     Effort: Pulmonary effort is normal.  Skin:    General: Skin is warm and dry.  Neurological:     Mental Status: He is alert and oriented to person, place, and time.     ED Results / Procedures / Treatments   Labs (all labs ordered are listed, but only abnormal results are displayed) Labs Reviewed - No data to display  EKG None  Radiology No results found.  Procedures Procedures    Medications Ordered in ED Medications - No data to display  ED Course/ Medical Decision Making/ A&P  Patient presenting with bilateral, right greater than left ear pain.  Exam consistent with otitis media.  Patient to be treated with antibiotics, NSAIDs, and return as needed.  Final Clinical Impression(s) / ED Diagnoses Final diagnoses:  None    Rx / DC  Orders ED Discharge Orders     None         Geoffery Lyons, MD 05/01/23 [phone]

## 2023-05-01 NOTE — ED Notes (Signed)
Pt reports attempting to irrigate his ear with a combination of rubbing alcohol, peroxide and saline w/o relief PTA.

## 2023-05-01 NOTE — ED Notes (Signed)
ED Provider at bedside. 

## 2023-05-01 NOTE — ED Triage Notes (Signed)
Pt arrived via POV c/o right side otalgia. Pt reports sinus congestion yesterday and right ear pain gradually worsened. Pt reports taking 800mg  Ibuprofen at apprx 2230 last night w/o relief.

## 2023-05-01 NOTE — Discharge Instructions (Signed)
Begin taking Zithromax as prescribed.  Begin taking ibuprofen 600 mg rotated with Tylenol 1000 mg every 3 hours as needed for pain.  Follow-up with primary doctor if not improving in the next few days, and return to the ER if symptoms significantly worsen or change.

## 2023-05-09 DIAGNOSIS — F900 Attention-deficit hyperactivity disorder, predominantly inattentive type: Secondary | ICD-10-CM | POA: Diagnosis not present

## 2023-05-09 DIAGNOSIS — R030 Elevated blood-pressure reading, without diagnosis of hypertension: Secondary | ICD-10-CM | POA: Diagnosis not present

## 2023-05-09 DIAGNOSIS — F1120 Opioid dependence, uncomplicated: Secondary | ICD-10-CM | POA: Diagnosis not present

## 2023-05-09 DIAGNOSIS — F1290 Cannabis use, unspecified, uncomplicated: Secondary | ICD-10-CM | POA: Diagnosis not present

## 2023-09-12 ENCOUNTER — Emergency Department (HOSPITAL_COMMUNITY): Payer: Self-pay

## 2023-09-12 ENCOUNTER — Emergency Department (HOSPITAL_COMMUNITY)
Admission: EM | Admit: 2023-09-12 | Discharge: 2023-09-12 | Disposition: A | Discharge status: Home / Self Care | Payer: Self-pay | Attending: Emergency Medicine | Admitting: Emergency Medicine

## 2023-09-12 ENCOUNTER — Encounter (HOSPITAL_COMMUNITY): Payer: Self-pay

## 2023-09-12 ENCOUNTER — Other Ambulatory Visit: Payer: Self-pay

## 2023-09-12 DIAGNOSIS — R1032 Left lower quadrant pain: Secondary | ICD-10-CM | POA: Insufficient documentation

## 2023-09-12 LAB — URINALYSIS, ROUTINE W REFLEX MICROSCOPIC
Bilirubin Urine: NEGATIVE
Glucose, UA: NEGATIVE mg/dL
Hgb urine dipstick: NEGATIVE
Ketones, ur: 5 mg/dL — AB
Leukocytes,Ua: NEGATIVE
Nitrite: NEGATIVE
Protein, ur: NEGATIVE mg/dL
Specific Gravity, Urine: 1.028 (ref 1.005–1.030)
pH: 5 (ref 5.0–8.0)

## 2023-09-12 LAB — CBC WITH DIFFERENTIAL/PLATELET
Abs Immature Granulocytes: 0.01 10*3/uL (ref 0.00–0.07)
Basophils Absolute: 0.1 10*3/uL (ref 0.0–0.1)
Basophils Relative: 1 %
Eosinophils Absolute: 0.4 10*3/uL (ref 0.0–0.5)
Eosinophils Relative: 6 %
HCT: 41.4 % (ref 39.0–52.0)
Hemoglobin: 14 g/dL (ref 13.0–17.0)
Immature Granulocytes: 0 %
Lymphocytes Relative: 30 %
Lymphs Abs: 2.1 10*3/uL (ref 0.7–4.0)
MCH: 31 pg (ref 26.0–34.0)
MCHC: 33.8 g/dL (ref 30.0–36.0)
MCV: 91.6 fL (ref 80.0–100.0)
Monocytes Absolute: 0.7 10*3/uL (ref 0.1–1.0)
Monocytes Relative: 10 %
Neutro Abs: 3.8 10*3/uL (ref 1.7–7.7)
Neutrophils Relative %: 53 %
Platelets: 188 10*3/uL (ref 150–400)
RBC: 4.52 MIL/uL (ref 4.22–5.81)
RDW: 13.1 % (ref 11.5–15.5)
WBC: 7 10*3/uL (ref 4.0–10.5)
nRBC: 0 % (ref 0.0–0.2)

## 2023-09-12 LAB — COMPREHENSIVE METABOLIC PANEL WITH GFR
ALT: 30 U/L (ref 0–44)
AST: 26 U/L (ref 15–41)
Albumin: 4.6 g/dL (ref 3.5–5.0)
Alkaline Phosphatase: 79 U/L (ref 38–126)
Anion gap: 10 (ref 5–15)
BUN: 18 mg/dL (ref 6–20)
CO2: 25 mmol/L (ref 22–32)
Calcium: 9 mg/dL (ref 8.9–10.3)
Chloride: 103 mmol/L (ref 98–111)
Creatinine, Ser: 0.92 mg/dL (ref 0.61–1.24)
GFR, Estimated: >60 mL/min (ref >60)
Glucose, Bld: 102 mg/dL — ABNORMAL HIGH (ref 70–99)
Potassium: 3.3 mmol/L — ABNORMAL LOW (ref 3.5–5.1)
Sodium: 138 mmol/L (ref 135–145)
Total Bilirubin: 1.2 mg/dL (ref 0.0–1.2)
Total Protein: 7.6 g/dL (ref 6.5–8.1)

## 2023-09-12 MED ORDER — IOHEXOL 300 MG/ML  SOLN
100.0000 mL | Freq: Once | INTRAMUSCULAR | Status: AC | PRN
  Administered 2023-09-12: 100 mL via INTRAVENOUS

## 2023-09-12 MED ORDER — KETOROLAC TROMETHAMINE 15 MG/ML IJ SOLN
15.0000 mg | Freq: Once | INTRAMUSCULAR | Status: AC
  Administered 2023-09-12: 15 mg via INTRAVENOUS
  Filled 2023-09-12: qty 1

## 2023-09-12 NOTE — ED Notes (Signed)
 Patient discharged. Provider spoke to patient. Paperwork given to patient and reviewed. Pt verbalized understanding. VSS. A+Ox4. Patient ambulated out of the ER with steady, independent gait. Iv removed intact without complications.

## 2023-09-12 NOTE — Discharge Instructions (Signed)
 Please follow-up closely with your primary care doctor on an outpatient basis.  Return to emergency department immediately for any new or worsening symptoms.

## 2023-09-12 NOTE — ED Provider Notes (Signed)
 [place] EMERGENCY DEPARTMENT AT [facility] Provider Note   CSN: [id number] Arrival date & time: 09/12/23  7829     History  Chief Complaint  Patient presents with   Groin Pain    John Brock is a 33 y.o. male.  Is a 33 year old male who presents to the emergency department with a chief complaint of pain to the left groin which has been ongoing since early this morning.  Patient denies any recent falls or blunt trauma, heavy lifting, known recent injuries.  Patient notes that he did "take a large hit off of a bong yesterday and had a coughing fit".  Patient denies any changes in bowel habits, dysuria, hematuria or testicular pain or tenderness.  He denies any history of similar symptoms in the past or history of hernia.   Groin Pain       Home Medications Prior to Admission medications   Medication Sig Start Date End Date Taking? Authorizing Provider  amoxicillin -clavulanate (AUGMENTIN ) 875-125 MG tablet Take 1 tablet by mouth every 12 (twelve) hours. 01/04/23   Early Glisson, MD  azithromycin  (ZITHROMAX ) 250 MG tablet Take 1 tablet (250 mg total) by mouth daily. 05/01/23   Orvilla Blander, MD  Buprenorphine HCl-Naloxone HCl 8-2 MG FILM SMARTSIG:0.5 Strip(s) Sublingual 3 Times Daily 08/11/21   [provider]  ibuprofen  (ADVIL ) 200 MG tablet Take 200 mg by mouth every 6 (six) hours as needed.    [provider]  lisdexamfetamine (VYVANSE) 50 MG capsule Take 50 mg by mouth every morning. 04/18/23   [provider]  Multiple Vitamin (MULTIVITAMIN WITH MINERALS) TABS tablet Take 1 tablet by mouth daily.    [provider]  permethrin  (ELIMITE ) 5 % cream Apply to affected area once at bedtime, leave for at least 8 hours and then rinse off in shower. Repeat weekly until resolved 12/24/22   Corbin Dess, PA-C  predniSONE  (DELTASONE ) 20 MG tablet Take 2 tablets (40 mg total) by mouth daily with breakfast. 12/24/22   Corbin Dess, PA-C      Allergies    Patient has no known allergies.    Review of Systems   Review of Systems  Gastrointestinal:        Pain to left lower pelvis  All other systems reviewed and are negative.   Physical Exam Updated Vital Signs BP (!) 153/94 (BP Location: Right Arm)   Pulse 97   Temp 97.8 F (36.6 C) (Temporal)   Resp 18   Ht 5\' 7"  (1.702 m)   Wt 109 kg   SpO2 100%   BMI 37.64 kg/m  Physical Exam Vitals and nursing note reviewed.  Constitutional:      Appearance: Normal appearance.  HENT:     Head: Normocephalic and atraumatic.     Nose: Nose normal.     Mouth/Throat:     Mouth: Mucous membranes are moist.  Eyes:     Extraocular Movements: Extraocular movements intact.     Conjunctiva/sclera: Conjunctivae normal.     Pupils: Pupils are equal, round, and reactive to light.  Cardiovascular:     Rate and Rhythm: Normal rate and regular rhythm.     Pulses: Normal pulses.     Heart sounds: Normal heart sounds. No murmur heard.    No gallop.  Pulmonary:     Effort: Pulmonary effort is normal. No respiratory distress.     Breath sounds: Normal breath sounds. No stridor. No wheezing or rales.  Abdominal:  General: Abdomen is flat. Bowel sounds are normal. There is no distension.     Palpations: Abdomen is soft.     Tenderness: There is no guarding.     Comments: Tenderness palpation along the left lower inguinal region  Genitourinary:    Testes: Normal.  Musculoskeletal:        General: Normal range of motion.     Cervical back: Normal range of motion and neck supple.  Skin:    General: Skin is warm and dry.  Neurological:     General: No focal deficit present.     Mental Status: He is alert and oriented to person, place, and time. Mental status is at baseline.  Psychiatric:        Mood and Affect: Mood normal.        Behavior: Behavior normal.        Thought Content: Thought content normal.        Judgment: Judgment normal.     ED Results  / Procedures / Treatments   Labs (all labs ordered are listed, but only abnormal results are displayed) Labs Reviewed  COMPREHENSIVE METABOLIC PANEL WITH GFR  CBC WITH DIFFERENTIAL/PLATELET  URINALYSIS, ROUTINE W REFLEX MICROSCOPIC    EKG None  Radiology No results found.  Procedures Procedures    Medications Ordered in ED Medications - No data to display  ED Course/ Medical Decision Making/ A&P                                 Medical Decision Making Amount and/or Complexity of Data Reviewed Labs: ordered. Radiology: ordered.  Risk Prescription drug management.   This patient presents to the ED for concern of left lower groin pain differential diagnosis includes hernia, testicular torsion, epididymitis, pyelonephritis, kidney stone    Additional history obtained:  Additional history obtained from none External records from outside source obtained and reviewed including none   Lab Tests:  I Ordered, and personally interpreted labs.  The pertinent results include: No leukocytosis, no anemia, mild hypokalemia, normal kidney function liver function, unremarkable urinalysis   Imaging Studies ordered:  I ordered imaging studies including CT scan of abdomen and pelvis I independently visualized and interpreted imaging which showed no acute surgical process I agree with the radiologist interpretation   Medicines ordered and prescription drug management:  I ordered medication including Toradol  for musculoskeletal pain Reevaluation of the patient after these medicines showed that the patient improved I have reviewed the patients home medicines and have made adjustments as needed   Problem List / ED Course:  Patient is doing much better at this time.  Discussed with patient CT scan of abdomen pelvis demonstrated no signs of acute pathology to include hernia.  Patient has no testicular pain or tenderness and do not suspect testicular torsion or epididymitis.   Suspect muscle strain at this time.  Discussed with patient about the need for continued symptomatic treatment on outpatient basis.  Do not suspect any further emergent workup is warranted at this time.  Close follow-up with PCP was discussed as well as strict turn precautions for any new or worsening symptoms.  Patient voiced understanding and had no additional questions.   Social Determinants of Health:  None           Final Clinical Impression(s) / ED Diagnoses Final diagnoses:  None    Rx / DC Orders ED Discharge Orders     None  Roselynn Connors, PA-C 09/12/23 1136    Dorenda Gandy, MD 09/13/23 1321

## 2023-09-12 NOTE — ED Triage Notes (Signed)
 Pt arrived via POV c/o left groin pain that began yesterday. Pt denies injury, denies heavy lifting, but does report taking a large "bong hit yesterday and coughing really hard."
